# Patient Record
Sex: Female | Born: 1935 | Race: White | Hispanic: No | Smoking: Never smoker
Health system: Southern US, Community
[De-identification: ages and names within clinical notes are randomized; demographics above are authoritative.]

## PROBLEM LIST (undated history)

## (undated) DIAGNOSIS — I10 Essential (primary) hypertension: Secondary | ICD-10-CM

## (undated) DIAGNOSIS — E119 Type 2 diabetes mellitus without complications: Secondary | ICD-10-CM

## (undated) HISTORY — PX: OTHER SURGICAL HISTORY: SHX169

---

## 2006-02-15 ENCOUNTER — Ambulatory Visit: Payer: Self-pay | Admitting: Internal Medicine

## 2006-07-25 ENCOUNTER — Ambulatory Visit: Payer: Self-pay | Admitting: Orthopedic Surgery

## 2006-07-25 ENCOUNTER — Other Ambulatory Visit: Payer: Self-pay

## 2006-08-01 ENCOUNTER — Ambulatory Visit: Payer: Self-pay | Admitting: Orthopedic Surgery

## 2007-01-09 ENCOUNTER — Ambulatory Visit: Payer: Self-pay | Admitting: Rheumatology

## 2009-09-22 ENCOUNTER — Ambulatory Visit: Payer: Self-pay | Admitting: Physician Assistant

## 2009-10-05 ENCOUNTER — Ambulatory Visit: Payer: Self-pay | Admitting: Pain Medicine

## 2009-10-08 ENCOUNTER — Ambulatory Visit: Payer: Self-pay | Admitting: Pain Medicine

## 2009-11-15 ENCOUNTER — Ambulatory Visit: Payer: Self-pay | Admitting: Pain Medicine

## 2011-03-05 ENCOUNTER — Emergency Department: Payer: Self-pay | Admitting: Internal Medicine

## 2011-07-11 ENCOUNTER — Emergency Department: Payer: Self-pay | Admitting: *Deleted

## 2013-05-01 ENCOUNTER — Inpatient Hospital Stay: Payer: Self-pay | Admitting: Internal Medicine

## 2013-05-01 LAB — COMPREHENSIVE METABOLIC PANEL
Albumin: 3.9 g/dL (ref 3.4–5.0)
Alkaline Phosphatase: 118 U/L (ref 50–136)
BUN: 23 mg/dL — ABNORMAL HIGH (ref 7–18)
Calcium, Total: 9 mg/dL (ref 8.5–10.1)
Chloride: 109 mmol/L — ABNORMAL HIGH (ref 98–107)
Creatinine: 0.97 mg/dL (ref 0.60–1.30)
EGFR (African American): 57 — ABNORMAL LOW
Glucose: 138 mg/dL — ABNORMAL HIGH (ref 65–99)
Potassium: 4.1 mmol/L (ref 3.5–5.1)
SGPT (ALT): 13 U/L (ref 12–78)

## 2013-05-01 LAB — CBC WITH DIFFERENTIAL/PLATELET
Basophil #: 0 10*3/uL (ref 0.0–0.1)
Eosinophil #: 0.2 10*3/uL (ref 0.0–0.7)
Eosinophil %: 1.5 %
HCT: 36.8 % (ref 35.0–47.0)
Lymphocyte #: 1.2 10*3/uL (ref 1.0–3.6)
MCH: 32.7 pg (ref 26.0–34.0)
MCHC: 33.4 g/dL (ref 32.0–36.0)
Neutrophil #: 8.3 10*3/uL — ABNORMAL HIGH (ref 1.4–6.5)
Platelet: 152 10*3/uL (ref 150–440)
RDW: 14 % (ref 11.5–14.5)

## 2013-05-01 LAB — URINALYSIS, COMPLETE
Bilirubin,UR: NEGATIVE
Glucose,UR: NEGATIVE mg/dL (ref 0–75)
Ketone: NEGATIVE
Nitrite: NEGATIVE
Ph: 6 (ref 4.5–8.0)
RBC,UR: 4 /HPF (ref 0–5)
WBC UR: 2 /HPF (ref 0–5)

## 2013-05-03 LAB — BASIC METABOLIC PANEL
Anion Gap: 6 — ABNORMAL LOW (ref 7–16)
BUN: 14 mg/dL (ref 7–18)
Creatinine: 0.84 mg/dL (ref 0.60–1.30)
EGFR (African American): >60
EGFR (Non-African Amer.): 59 — ABNORMAL LOW
Osmolality: 287 (ref 275–301)
Potassium: 3.8 mmol/L (ref 3.5–5.1)

## 2013-05-03 LAB — CBC WITH DIFFERENTIAL/PLATELET
Basophil #: 0 10*3/uL (ref 0.0–0.1)
Basophil %: 0.6 %
Eosinophil #: 0.2 10*3/uL (ref 0.0–0.7)
HCT: 34 % — ABNORMAL LOW (ref 35.0–47.0)
HGB: 11.6 g/dL — ABNORMAL LOW (ref 12.0–16.0)
Lymphocyte #: 1.2 10*3/uL (ref 1.0–3.6)
MCH: 32.9 pg (ref 26.0–34.0)
MCHC: 34.2 g/dL (ref 32.0–36.0)
MCV: 97 fL (ref 80–100)
Neutrophil #: 3 10*3/uL (ref 1.4–6.5)
RBC: 3.52 10*6/uL — ABNORMAL LOW (ref 3.80–5.20)
WBC: 5.1 10*3/uL (ref 3.6–11.0)

## 2013-05-04 LAB — CBC WITH DIFFERENTIAL/PLATELET
Basophil #: 0 10*3/uL (ref 0.0–0.1)
Basophil %: 0.7 %
Eosinophil #: 0.2 10*3/uL (ref 0.0–0.7)
Eosinophil %: 4.2 %
HCT: 32.4 % — ABNORMAL LOW (ref 35.0–47.0)
HGB: 11.2 g/dL — ABNORMAL LOW (ref 12.0–16.0)
Lymphocyte #: 1.3 10*3/uL (ref 1.0–3.6)
Lymphocyte %: 25.6 %
MCH: 32.7 pg (ref 26.0–34.0)
MCHC: 34.5 g/dL (ref 32.0–36.0)
MCV: 95 fL (ref 80–100)
Monocyte #: 0.7 x10 3/mm (ref 0.2–0.9)
Monocyte %: 12.7 %
Neutrophil #: 2.9 10*3/uL (ref 1.4–6.5)
Neutrophil %: 56.8 %
Platelet: 148 10*3/uL — ABNORMAL LOW (ref 150–440)
RBC: 3.41 10*6/uL — ABNORMAL LOW (ref 3.80–5.20)
RDW: 13.8 % (ref 11.5–14.5)
WBC: 5.1 10*3/uL (ref 3.6–11.0)

## 2013-05-04 LAB — BASIC METABOLIC PANEL
BUN: 11 mg/dL (ref 7–18)
Calcium, Total: 8.8 mg/dL (ref 8.5–10.1)
Chloride: 112 mmol/L — ABNORMAL HIGH (ref 98–107)
Co2: 24 mmol/L (ref 21–32)
EGFR (African American): >60
EGFR (Non-African Amer.): >60
Glucose: 126 mg/dL — ABNORMAL HIGH (ref 65–99)
Osmolality: 288 (ref 275–301)
Sodium: 144 mmol/L (ref 136–145)

## 2013-05-06 LAB — CULTURE, BLOOD (SINGLE)

## 2013-08-11 ENCOUNTER — Emergency Department: Payer: Self-pay | Admitting: Emergency Medicine

## 2013-08-11 LAB — BASIC METABOLIC PANEL
Anion Gap: 6 — ABNORMAL LOW (ref 7–16)
BUN: 20 mg/dL — ABNORMAL HIGH (ref 7–18)
Calcium, Total: 9 mg/dL (ref 8.5–10.1)
Chloride: 110 mmol/L — ABNORMAL HIGH (ref 98–107)
Creatinine: 0.8 mg/dL (ref 0.60–1.30)
EGFR (African American): >60
EGFR (Non-African Amer.): >60
Glucose: 125 mg/dL — ABNORMAL HIGH (ref 65–99)
Osmolality: 285 (ref 275–301)
Potassium: 4 mmol/L (ref 3.5–5.1)
Sodium: 141 mmol/L (ref 136–145)

## 2013-08-11 LAB — CBC WITH DIFFERENTIAL/PLATELET
Basophil #: 0 10*3/uL (ref 0.0–0.1)
Basophil %: 0.6 %
Eosinophil %: 2.4 %
HCT: 39.7 % (ref 35.0–47.0)
Lymphocyte #: 1.8 10*3/uL (ref 1.0–3.6)
Lymphocyte %: 22.8 %
MCH: 31.9 pg (ref 26.0–34.0)
MCHC: 33.6 g/dL (ref 32.0–36.0)
MCV: 95 fL (ref 80–100)
Monocyte #: 0.8 x10 3/mm (ref 0.2–0.9)
Monocyte %: 10.3 %
Neutrophil #: 4.9 10*3/uL (ref 1.4–6.5)
RBC: 4.18 10*6/uL (ref 3.80–5.20)
WBC: 7.7 10*3/uL (ref 3.6–11.0)

## 2013-08-11 LAB — TROPONIN I: Troponin-I: <0.02 ng/mL

## 2013-08-14 ENCOUNTER — Ambulatory Visit: Payer: Self-pay | Admitting: Gastroenterology

## 2014-12-25 NOTE — Discharge Summary (Signed)
PATIENT NAME:  Denise Frank, Denise Frank MR#:  [id number] DATE OF BIRTH:  1915-02-06  DATE OF ADMISSION:  05/01/2013 DATE OF DISCHARGE:  05/05/2013  REASON FOR ADMISSION: Left upper extremity cellulitis.   HISTORY OF PRESENT ILLNESS: Please see the dictated HPI done by myself on 05/01/2013.   PAST MEDICAL HISTORY:  1.  Polymyalgia rheumatica.  2.  Type 2 diabetes. 3.  Hyperlipidemia.  4.  Hypothyroidism.  5.  Benign hypertension.  6.  Venous stasis with peripheral edema.  7.  Osteoarthritis.  8.  Osteoporosis.  9.  History of esophageal stricture, status post dilatation.  10. Status post hysterectomy.   MEDICATIONS ON ADMISSION: Please see admission note.   ALLERGIES: EVISTA, DEMEROL, FOSAMAX, TRAMADOL, CYMBALTA, SANCTURA, REMERON AND ELAVIL.   SOCIAL HISTORY, FAMILY HISTORY AND REVIEW OF SYSTEMS: As per admission note.   PHYSICAL EXAM:  GENERAL: The patient was in no acute distress.  VITAL SIGNS: Stable and she was afebrile. HEENT: Unremarkable.  NECK: Supple without JVD.  LUNGS: Clear.  CARDIOVASCULAR: Revealed a regular rate and rhythm. Normal S1, S2.  ABDOMEN: Soft and nontender.  EXTREMITIES: Without edema.  SKIN: Revealed swelling and erythema with tenderness and warmth to the left hand.  NEUROLOGIC: Grossly nonfocal.   HOSPITAL COURSE: The patient was admitted with progressive left upper extremity cellulitis due to a cat bite. Cultures were sent off and she was started on IV fluids with IV antibiotics. Her sugars were monitored with Accu-Cheks before meals and at bedtime and sliding scale insulin was used as needed. Baseline labs were stable. The patient improved with IV antibiotics. Blood cultures remained negative. She was switched to oral antibiotics with continued improvement of her cellulitis and pain. By 05/05/2013, the patient was stable and ready for discharge on oral antibiotics.   DISCHARGE DIAGNOSES:  1.  Left upper extremity cellulitis due to cat bite.  2.  Type  2 diabetes.  3.  Polymyalgia rheumatica.  4.  Benign hypertension.  5.  Hyperlipidemia.   DISCHARGE MEDICATIONS:  1.  Aspirin 81 mg p.o. daily.  2.  Doxycycline 100 mg p.o. b.i.d. x 10 days.  3.  Norco 10/325 mg 1 p.o. q.6 hours p.r.n. pain.  4.  Losartan 50 mg p.o. daily.  5.  Amaryl 6 mg p.o. daily.  6.  Januvia 100 mg p.o. daily.  7.  Zofran 4 mg p.o. q.4 hours p.r.n. nausea and vomiting.  8.  Benadryl 50 mg p.o. q.8 hours p.r.n. itching.  9.  Ambien 10 mg p.o. at bedtime.  10. MiraLax 17 grams p.o. at bedtime p.r.n. constipation. 11. Melatonin 3 mg p.o. at bedtime.  12. Augmentin 875 mg p.o. b.i.d. x 10 days.  13. Synthroid 0.1 mg p.o. daily.  FOLLOWUP PLANS AND APPOINTMENTS: The patient was discharged with home health, but she deferred. She was discharged on an 1800 calorie, ADA diet. She is to complete her course of antibiotics. She will follow up with me in the office within 1 week's time, sooner if needed.  ____________________________ Duane LopeJeffrey D. Judithann SheenSparks, MD jds:aw D: 05/20/2013 09:52:23 ET T: 05/20/2013 10:03:16 ET JOB#: [id number]  cc: Duane LopeJeffrey D. Judithann SheenSparks, MD, <Dictator> Ajaya Crutchfield Rodena Medin Treacy Holcomb MD ELECTRONICALLY SIGNED 05/20/2013 16:54

## 2014-12-25 NOTE — H&P (Signed)
PATIENT NAME:  Denise Frank, Denise Frank MR#:  [id number] DATE OF BIRTH:  May 28, 1915  DATE OF ADMISSION:  05/01/2013  REASON FOR ADMISSION:  Progressive left upper extremity cellulitis.   HISTORY OF PRESENT ILLNESS:  The patient is a 81 year old female with a history of type 2 diabetes, hypothyroidism, benign hypertension, and hyperlipidemia, as well as polymyalgia rheumatica. The patient presented to the office two days ago with a cat bite to the left hand. Was given 1 gram of Rocephin IM with a tetanus booster and placed on Augmentin. Presents now with worsening swelling, pain, and erythema of the left hand and arm. Denies fevers. Some nausea but no vomiting. No diarrhea. No chest pain or shortness of breath. She is now admitted for further evaluation.   PAST MEDICAL HISTORY:  1.  Polymyalgia rheumatica.  2.  Type 2 diabetes.  3.  Hyperlipidemia.  4.  Hypothyroidism.  5.  Benign hypertension.  6.  Venous stasis with peripheral edema.  7.  Osteoarthritis.  8.  Osteoporosis.  9.  A history of esophageal stricture status post dilatation. 10.  Status post hysterectomy.   MEDICATIONS:  1.  Amaryl 6 mg p.o. daily.  2.  Januvia 100 mg p.o. daily.  3.  Lasix 20 mg p.o. daily.  4.  Synthroid 100 mcg p.o. daily.  5.  Benadryl 50 mg p.o. at bedtime p.r.n.  6.  Norco 10/325, 1 p.o. q.6h. p.r.n., pain.  7.  MiraLAX 17 grams p.o. daily.   ALLERGIES:  EVISTA, DEMEROL, FOSAMAX, TRAMADOL, CYMBALTA, SANCTURA, REMERON, AND ELAVIL.   SOCIAL HISTORY:  Negative for alcohol or tobacco abuse. She has two daughters.   FAMILY HISTORY:  Positive for lung cancer, diabetes, brain cancer, and Alzheimer dementia.   REVIEW OF SYSTEMS:  As per HPI.   PHYSICAL EXAMINATION:  GENERAL:  The patient is in no acute distress.  VITAL SIGNS:  Vitals remarkable for a blood pressure of 118/50 with a temperature of 97.4. HEENT:  Pupils equally round and reactive to light and accommodation. Extraocular movements are intact.  Sclerae are anicteric. Conjunctivae are clear.  OROPHARYNX:  Clear.  NECK:  Supple without JVD. No adenopathy or thyromegaly noted.  LUNGS:  Clear to auscultation and percussion without wheezes, rales or rhonchi. No dullness. Respiratory effort is normal.  CARDIAC:  Regular rate and rhythm. Normal S1, S2. No significant rubs, murmurs, or gallops. PMI is nondisplaced. Chest wall is nontender.  ABDOMEN:  Soft, nontender, with normoactive bowel sounds. No organomegaly or masses were appreciated. No hernias or bruits were noted.  EXTREMITIES:  Without clubbing, cyanosis, edema. Pulses were 2+ bilaterally.  SKIN:  Warm and dry with a bite noted to the left hand with surrounding erythema, warmth, tenderness. No purulence. The erythema tracks up past her wrist.  NEUROLOGIC EXAM:  Grossly nonfocal.   ASSESSMENT: 1.  Progressive left upper extremity cellulitis.  2.  Cat bite.  3.  Type 2 diabetes.  4.  Polymyalgia rheumatica. 5.  Hyperlipidemia.  6.  Benign hypertension.  7.  Hypothyroidism.   PLAN:  The patient was admitted to the floor. We will send blood cultures. She will be started on IV fluids with IV antibiotics. We will monitor her sugars with Accu-Cheks before meals and at bedtime and add sliding scale insulin. We will obtain baseline labs on admission. Further treatment and evaluation will depend upon the patient's progress.   TOTAL TIME SPENT ON THIS PATIENT:  45 minutes.   ____________________________ Duane LopeJeffrey D. Judithann SheenSparks, MD jds:jm D: 05/01/2013  14:54:43 ET T: 05/01/2013 15:06:27 ET JOB#: [id number]  cc: Duane Lope. Judithann Sheen, MD, <Dictator> JEFFREY Rodena Medin MD ELECTRONICALLY SIGNED 05/01/2013 21:02

## 2015-11-26 ENCOUNTER — Emergency Department: Payer: Medicare Other

## 2015-11-26 ENCOUNTER — Encounter: Payer: Self-pay | Admitting: Emergency Medicine

## 2015-11-26 ENCOUNTER — Emergency Department
Admission: EM | Admit: 2015-11-26 | Discharge: 2015-11-26 | Disposition: A | Discharge status: Home / Self Care | Payer: Medicare Other | Attending: Emergency Medicine | Admitting: Emergency Medicine

## 2015-11-26 DIAGNOSIS — Y9289 Other specified places as the place of occurrence of the external cause: Secondary | ICD-10-CM | POA: Insufficient documentation

## 2015-11-26 DIAGNOSIS — S60222A Contusion of left hand, initial encounter: Secondary | ICD-10-CM | POA: Diagnosis not present

## 2015-11-26 DIAGNOSIS — Y939 Activity, unspecified: Secondary | ICD-10-CM | POA: Insufficient documentation

## 2015-11-26 DIAGNOSIS — S5012XA Contusion of left forearm, initial encounter: Secondary | ICD-10-CM

## 2015-11-26 DIAGNOSIS — S0083XA Contusion of other part of head, initial encounter: Secondary | ICD-10-CM | POA: Diagnosis not present

## 2015-11-26 DIAGNOSIS — Y999 Unspecified external cause status: Secondary | ICD-10-CM | POA: Diagnosis not present

## 2015-11-26 DIAGNOSIS — R6884 Jaw pain: Secondary | ICD-10-CM | POA: Diagnosis present

## 2015-11-26 DIAGNOSIS — E119 Type 2 diabetes mellitus without complications: Secondary | ICD-10-CM | POA: Diagnosis not present

## 2015-11-26 DIAGNOSIS — W0110XA Fall on same level from slipping, tripping and stumbling with subsequent striking against unspecified object, initial encounter: Secondary | ICD-10-CM | POA: Insufficient documentation

## 2015-11-26 DIAGNOSIS — S7002XA Contusion of left hip, initial encounter: Secondary | ICD-10-CM | POA: Diagnosis not present

## 2015-11-26 DIAGNOSIS — H9110 Presbycusis, unspecified ear: Secondary | ICD-10-CM | POA: Insufficient documentation

## 2015-11-26 HISTORY — DX: Type 2 diabetes mellitus without complications: E11.9

## 2015-11-26 LAB — URINALYSIS COMPLETE WITH MICROSCOPIC (ARMC ONLY)
BILIRUBIN URINE: NEGATIVE
Ketones, ur: NEGATIVE mg/dL
NITRITE: NEGATIVE
PROTEIN: 100 mg/dL — AB
SPECIFIC GRAVITY, URINE: 1.007 (ref 1.005–1.030)
Trans Epithel, UA: 1
pH: 7 (ref 5.0–8.0)

## 2015-11-26 NOTE — ED Provider Notes (Signed)
[facility] Emergency Department Provider Note  ____________________________________________  Time seen: 8:40 PM  I have reviewed the triage vital signs and the nursing notes.   HISTORY  Chief Complaint Fall    HPI Denise Frank is a 82 y.o. female brought [facility] the ED from [facility] after a [facility]. Patient states that she lost her balance and tripped and fell. No loss of consciousness. She fell and hit the left side of her face and onto her left side. She complains of some pain in her left jaw area. Also some pain in the left hip. No chest pain shortness of breath headache dizziness or syncope numbness tingling weakness vision changes or vomiting. Has been in her usual state of health. Does not take blood thinners.     Past Medical History  Diagnosis Date  . Diabetes mellitus without complication (HCC)   Presbycusis   There are no active problems to display for this patient.    History reviewed. No pertinent past surgical history.   No current outpatient prescriptions on file.   Allergies Demerol   No family history on file.  Social History Social History  Substance Use Topics  . Smoking status: Never Smoker   . Smokeless tobacco: None  . Alcohol Use: No    Review of Systems  Constitutional:   No fever or chills. No weight changes Eyes:   No vision changes.  ENT:   No sore throat. No rhinorrhea.Positive Left jaw pain  Cardiovascular:   No chest pain. Respiratory:   No dyspnea or cough. Gastrointestinal:   Negative for abdominal pain, vomiting and diarrhea.  No BRBPR or melena. Genitourinary:   Negative for dysuria or difficulty urinating. Musculoskeletal:   Positive left hip pain  Skin:   Negative for rash. Neurological:   Negative for headaches, focal weakness or numbness.  10-point ROS otherwise negative.  ____________________________________________   PHYSICAL EXAM:  VITAL SIGNS: ED Triage Vitals  Enc Vitals Group      BP --      Pulse Rate 11/26/15 2036 81     Resp 11/26/15 2036 14     Temp 11/26/15 2036 97.9 F (36.6 C)     Temp Source 11/26/15 2036 Oral     SpO2 11/26/15 2036 99 %     Weight 11/26/15 2036 138 lb (62.596 kg)     Height 11/26/15 2036  (1.651 m)     Head Cir --      Peak Flow --      Pain Score --      Pain Loc --      Pain Edu? --      Excl. in GC? --     Vital signs reviewed, nursing assessments reviewed.   Constitutional:   Alert and oriented. Well appearing and in no distress. Eyes:   No scleral icterus. No conjunctival pallor. PERRL. EOMI. There is some mild watery bilateral conjunctivitis consistent with allergies ENT   Head:   Normocephalic with large hematoma overlying the left parotid area. Tenderness at the angle of the left mandible. No instability or deformity. TMs normal. No hemotympanum   Nose:   No congestion/rhinnorhea. No septal hematoma   Mouth/Throat:   MMM, no pharyngeal erythema. No peritonsillar mass. No intraoral injury   Neck:   No stridor. No SubQ emphysema. No meningismus. Hematological/Lymphatic/Immunilogical:   No cervical lymphadenopathy. Cardiovascular:   RRR. Symmetric bilateral radial and DP pulses.  No murmurs.  Respiratory:   Normal respiratory  effort without tachypnea nor retractions. Breath sounds are clear and equal bilaterally. No wheezes/rales/rhonchi. Gastrointestinal:   Soft and nontender. Non distended. There is no CVA tenderness.  No rebound, rigidity, or guarding. Genitourinary:   deferred Musculoskeletal:  No midline spinal tenderness. There is a 2 cm hematoma overlying the proximal left radius and it is tender to the touch over the left radius in this area. No pain with pronation or supination. There is also tenderness at the left hip over the greater trochanter. Chronic left lateral shin pain which the patient states is due to varicose veins. This is at her baseline. No new swelling or edema of the lower  extremities. Neurologic:   Normal speech and language.  CN 2-10 normal. Motor grossly intact. Sensation intact in all distal extremities No gross focal neurologic deficits are appreciated.  Skin:    Skin is warm, dry, with small very superficial abrasion over the dorsal left radial hand. No rash noted.  No petechiae, purpura, or bullae. Psychiatric:   Mood and affect are normal. ____________________________________________    LABS (pertinent positives/negatives) (all labs ordered are listed, but only abnormal results are displayed) Labs Reviewed  URINALYSIS COMPLETEWITH MICROSCOPIC ([facility] ONLY) - Abnormal; Notable for the following:    Color, Urine STRAW (*)    APPearance CLEAR (*)    Glucose, UA >500 (*)    Hgb urine dipstick 2+ (*)    Protein, ur 100 (*)    Leukocytes, UA 1+ (*)    Bacteria, UA RARE (*)    Squamous Epithelial / LPF 0-5 (*)    All other components within normal limits  URINE CULTURE   ____________________________________________   EKG  Interpreted by me Normal sinus rhythm rate of 82, normal axis and intervals. Normal QRS ST segments and T waves.  ____________________________________________    RADIOLOGY  X-ray left forearm unremarkable X-ray left hip unremarkable CT head cervical spine and maxillofacial unremarkable except for left masseter hematoma.  ____________________________________________   PROCEDURES   ____________________________________________   INITIAL IMPRESSION / ASSESSMENT AND PLAN / ED COURSE  Pertinent labs & imaging results that were available during my care of the patient were reviewed by me and considered in my medical decision making (see chart for details).  Patient presents after a fall with evident trauma the left face left elbow and left hip. We'll get x-rays and CTs. Also has abrasion over the right hand that does not require any treatment at this time.   ----------------------------------------- 10:33 PM on  11/26/2015 -----------------------------------------  Workup negative. No UTI. Urine culture sent. Able to bear weight. We'll discharge home, Tylenol when necessary. Follow-up with primary care.    ____________________________________________   FINAL CLINICAL IMPRESSION(S) / ED DIAGNOSES  Final diagnoses:  Facial hematoma, initial encounter  Traumatic hematoma of forearm, left, initial encounter  Contusion of left hip, initial encounter      Sharman CheekPhillip Assia Meanor, MD 11/26/15 2233

## 2015-11-26 NOTE — Discharge Instructions (Signed)
Your xrays do not show any fractures of the left forearm or hip.  Your CT scans confirm that you have a hematoma (collection of blood) over the left jaw area arising from the facial muscles, but no serious injuries or fractures.  Keep ice on the painful areas tonight and take tylenol as needed for pain.  Contusion A contusion is a deep bruise. Contusions are the result of a blunt injury to tissues and muscle fibers under the skin. The injury causes bleeding under the skin. The skin overlying the contusion may turn blue, purple, or yellow. Minor injuries will give you a painless contusion, but more severe contusions may stay painful and swollen for a few weeks.  CAUSES  This condition is usually caused by a blow, trauma, or direct force to an area of the body. SYMPTOMS  Symptoms of this condition include:  Swelling of the injured area.  Pain and tenderness in the injured area.  Discoloration. The area may have redness and then turn blue, purple, or yellow. DIAGNOSIS  This condition is diagnosed based on a physical exam and medical history. An X-ray, CT scan, or MRI may be needed to determine if there are any associated injuries, such as broken bones (fractures). TREATMENT  Specific treatment for this condition depends on what area of the body was injured. In general, the best treatment for a contusion is resting, icing, applying pressure to (compression), and elevating the injured area. This is often called the RICE strategy. Over-the-counter anti-inflammatory medicines may also be recommended for pain control.  HOME CARE INSTRUCTIONS   Rest the injured area.  If directed, apply ice to the injured area:  Put ice in a plastic bag.  Place a towel between your skin and the bag.  Leave the ice on for 20 minutes, 2-3 times per day.  If directed, apply light compression to the injured area using an elastic bandage. Make sure the bandage is not wrapped too tightly. Remove and reapply the  bandage as directed by your health care provider.  If possible, raise (elevate) the injured area above the level of your heart while you are sitting or lying down.  Take over-the-counter and prescription medicines only as told by your health care provider. SEEK MEDICAL CARE IF:  Your symptoms do not improve after several days of treatment.  Your symptoms get worse.  You have difficulty moving the injured area. SEEK IMMEDIATE MEDICAL CARE IF:   You have severe pain.  You have numbness in a hand or foot.  Your hand or foot turns pale or cold.   This information is not intended to replace advice given to you by your health care provider. Make sure you discuss any questions you have with your health care provider.   Document Released: 05/31/2005 Document Revised: 05/12/2015 Document Reviewed: 01/06/2015 Elsevier Interactive Patient Education 2016 [employer]  Hematoma A hematoma is a collection of blood under the skin, in an organ, in a body space, in a joint space, or in other tissue. The blood can clot to form a lump that you can see and feel. The lump is often firm and may sometimes become sore and tender. Most hematomas get better in a few days to weeks. However, some hematomas may be serious and require medical care. Hematomas can range in size from very small to very large. CAUSES  A hematoma can be caused by a blunt or penetrating injury. It can also be caused by spontaneous leakage from a blood vessel under  the skin. Spontaneous leakage from a blood vessel is more likely to occur in older people, especially those taking blood thinners. Sometimes, a hematoma can develop after certain medical procedures. SIGNS AND SYMPTOMS   A firm lump on the body.  Possible pain and tenderness in the area.  Bruising.Blue, dark blue, purple-red, or yellowish skin may appear at the site of the hematoma if the hematoma is close to the surface of the skin. For hematomas in deeper tissues or  body spaces, the signs and symptoms may be subtle. For example, an intra-abdominal hematoma may cause abdominal pain, weakness, fainting, and shortness of breath. An intracranial hematoma may cause a headache or symptoms such as weakness, trouble speaking, or a change in consciousness. DIAGNOSIS  A hematoma can usually be diagnosed based on your medical history and a physical exam. Imaging tests may be needed if your health care provider suspects a hematoma in deeper tissues or body spaces, such as the abdomen, head, or chest. These tests may include ultrasonography or a CT scan.  TREATMENT  Hematomas usually go away on their own over time. Rarely does the blood need to be drained out of the body. Large hematomas or those that may affect vital organs will sometimes need surgical drainage or monitoring. HOME CARE INSTRUCTIONS   Apply ice to the injured area:   Put ice in a plastic bag.   Place a towel between your skin and the bag.   Leave the ice on for 20 minutes, 2-3 times a day for the first 1 to 2 days.   After the first 2 days, switch to using warm compresses on the hematoma.   Elevate the injured area to help decrease pain and swelling. Wrapping the area with an elastic bandage may also be helpful. Compression helps to reduce swelling and promotes shrinking of the hematoma. Make sure the bandage is not wrapped too tight.   If your hematoma is on a lower extremity and is painful, crutches may be helpful for a couple days.   Only take over-the-counter or prescription medicines as directed by your health care provider. SEEK IMMEDIATE MEDICAL CARE IF:   You have increasing pain, or your pain is not controlled with medicine.   You have a fever.   You have worsening swelling or discoloration.   Your skin over the hematoma breaks or starts bleeding.   Your hematoma is in your chest or abdomen and you have weakness, shortness of breath, or a change in consciousness.  Your  hematoma is on your scalp (caused by a fall or injury) and you have a worsening headache or a change in alertness or consciousness. MAKE SURE YOU:   Understand these instructions.  Will watch your condition.  Will get help right away if you are not doing well or get worse.   This information is not intended to replace advice given to you by your health care provider. Make sure you discuss any questions you have with your health care provider.   Document Released: 04/04/2004 Document Revised: 04/23/2013 Document Reviewed: 01/29/2013 Elsevier Interactive Patient Education [employer]

## 2015-11-26 NOTE — ED Notes (Signed)
Pt comes into the ED via EMS from [facility] c/o fall.  Hematoma to left side of jaw, bruising to the left hand, denies LOC, and can move mandible well without any complaints.  Patient denies any blood thinners.

## 2015-11-28 LAB — URINE CULTURE

## 2017-04-11 ENCOUNTER — Encounter: Payer: Self-pay | Admitting: Emergency Medicine

## 2017-04-11 ENCOUNTER — Emergency Department
Admission: EM | Admit: 2017-04-11 | Discharge: 2017-04-11 | Disposition: A | Discharge status: Home / Self Care | Payer: Medicare Other | Attending: Emergency Medicine | Admitting: Emergency Medicine

## 2017-04-11 DIAGNOSIS — E119 Type 2 diabetes mellitus without complications: Secondary | ICD-10-CM | POA: Insufficient documentation

## 2017-04-11 DIAGNOSIS — R531 Weakness: Secondary | ICD-10-CM | POA: Insufficient documentation

## 2017-04-11 LAB — CBC WITH DIFFERENTIAL/PLATELET
Basophils Absolute: 0.1 10*3/uL (ref 0–0.1)
Basophils Relative: 1 %
Eosinophils Absolute: 0.5 10*3/uL (ref 0–0.7)
Eosinophils Relative: 7 %
HCT: 35.9 % (ref 35.0–47.0)
Hemoglobin: 11.9 g/dL — ABNORMAL LOW (ref 12.0–16.0)
Lymphocytes Relative: 21 %
Lymphs Abs: 1.4 10*3/uL (ref 1.0–3.6)
MCH: 31.3 pg (ref 26.0–34.0)
MCHC: 33.2 g/dL (ref 32.0–36.0)
MCV: 94.1 fL (ref 80.0–100.0)
Monocytes Absolute: 0.6 10*3/uL (ref 0.2–0.9)
Monocytes Relative: 9 %
Neutro Abs: 4.3 10*3/uL (ref 1.4–6.5)
Neutrophils Relative %: 62 %
Platelets: 196 10*3/uL (ref 150–440)
RBC: 3.81 MIL/uL (ref 3.80–5.20)
RDW: 14 % (ref 11.5–14.5)
WBC: 6.8 10*3/uL (ref 3.6–11.0)

## 2017-04-11 LAB — BASIC METABOLIC PANEL
Anion gap: 6 (ref 5–15)
BUN: 17 mg/dL (ref 6–20)
CO2: 22 mmol/L (ref 22–32)
Calcium: 8.9 mg/dL (ref 8.9–10.3)
Chloride: 111 mmol/L (ref 101–111)
Creatinine, Ser: 1.03 mg/dL — ABNORMAL HIGH (ref 0.44–1.00)
GFR calc Af Amer: 50 mL/min — ABNORMAL LOW (ref >60)
GFR calc non Af Amer: 43 mL/min — ABNORMAL LOW (ref >60)
Glucose, Bld: 174 mg/dL — ABNORMAL HIGH (ref 65–99)
Potassium: 4.4 mmol/L (ref 3.5–5.1)
Sodium: 139 mmol/L (ref 135–145)

## 2017-04-11 LAB — URINALYSIS, COMPLETE (UACMP) WITH MICROSCOPIC
BACTERIA UA: NONE SEEN
BILIRUBIN URINE: NEGATIVE
Glucose, UA: NEGATIVE mg/dL
Ketones, ur: NEGATIVE mg/dL
LEUKOCYTES UA: NEGATIVE
NITRITE: NEGATIVE
PH: 5 (ref 5.0–8.0)
Protein, ur: NEGATIVE mg/dL
SPECIFIC GRAVITY, URINE: 1.012 (ref 1.005–1.030)

## 2017-04-11 LAB — TROPONIN I: Troponin I: <0.03 ng/mL (ref <0.03)

## 2017-04-11 NOTE — ED Triage Notes (Signed)
[facility] c/o was pt was eating breakfast and residents noticed pt was leaning to RT side, pt has chronic weakness but residents states pt was not at baseline weakness. Pt A&Ox4, denies pain. VS stable.

## 2017-04-11 NOTE — Discharge Instructions (Signed)
Please seek medical attention for any high fevers, chest pain, shortness of breath, change in behavior, persistent vomiting, bloody stool or any other new or concerning symptoms.  

## 2017-04-11 NOTE — ED Provider Notes (Signed)
[facility] Emergency Department Provider Note  ____________________________________________   I have reviewed the triage vital signs and the nursing notes.   HISTORY  Chief Complaint Weakness   History limited by: Not Limited   HPI Denise Frank is a 83 y.o. female who presents to the emergency department today via EMS because of concerns for weakness. The patient states that she has felt weak all morning. Today at breakfast one of her table mates noticed that she slumped over. Since that time she has been awake and alert. She typically is not that week. She denies any headache. Denies any chest pain or shortness of breath. She denies any recent illness. She does state that recently she has been having a harder time urinating. She denies any pain with urination.   Past Medical History:  Diagnosis Date  . Diabetes mellitus without complication (HCC)     There are no active problems to display for this patient.   History reviewed. No pertinent surgical history.  Prior to Admission medications   Not on File    Allergies Demerol [meperidine]  No family history on file.  Social History Social History  Substance Use Topics  . Smoking status: Never Smoker  . Smokeless tobacco: Never Used  . Alcohol use No    Review of Systems Constitutional: No fever/chills Eyes: No visual changes. ENT: No sore throat. Cardiovascular: Denies chest pain. Respiratory: Denies shortness of breath. Gastrointestinal: No abdominal pain.  No nausea, no vomiting.  No diarrhea.   Genitourinary: Negative for dysuria. Musculoskeletal: Negative for back pain. Skin: Negative for rash. Neurological: Negative for headaches, focal weakness or numbness.  ____________________________________________   PHYSICAL EXAM:  VITAL SIGNS: ED Triage Vitals [04/11/17 0825]  Enc Vitals Group     BP 124/72     Pulse Rate 73     Resp 16     Temp      Temp src      SpO2 97  %     Weight      Height      Head Circumference      Peak Flow      Pain Score 0    Constitutional: Alert and oriented. Well appearing and in no distress. Eyes: Conjunctivae are normal.  ENT   Head: Normocephalic and atraumatic.   Nose: No congestion/rhinnorhea.   Mouth/Throat: Mucous membranes are moist.   Neck: No stridor. Hematological/Lymphatic/Immunilogical: No cervical lymphadenopathy. Cardiovascular: Normal rate, regular rhythm.  No murmurs, rubs, or gallops.  Respiratory: Normal respiratory effort without tachypnea nor retractions. Breath sounds are clear and equal bilaterally. No wheezes/rales/rhonchi. Gastrointestinal: Soft and non tender. No rebound. No guarding.  Genitourinary: Deferred Musculoskeletal: Normal range of motion in all extremities. No lower extremity edema. Neurologic:  Normal speech and language. No gross focal neurologic deficits are appreciated.  Skin:  Skin is warm, dry and intact. No rash noted. Psychiatric: Mood and affect are normal. Speech and behavior are normal. Patient exhibits appropriate insight and judgment.  ____________________________________________    LABS (pertinent positives/negatives)  Labs Reviewed  CBC WITH DIFFERENTIAL/PLATELET - Abnormal; Notable for the following:       Result Value   Hemoglobin 11.9 (*)    All other components within normal limits  BASIC METABOLIC PANEL - Abnormal; Notable for the following:    Glucose, Bld 174 (*)    Creatinine, Ser 1.03 (*)    GFR calc non Af Amer 43 (*)    GFR calc Af Amer 50 (*)  All other components within normal limits  URINALYSIS, COMPLETE (UACMP) WITH MICROSCOPIC - Abnormal; Notable for the following:    Color, Urine YELLOW (*)    APPearance CLEAR (*)    Hgb urine dipstick SMALL (*)    Squamous Epithelial / LPF 0-5 (*)    All other components within normal limits  TROPONIN I     ____________________________________________   EKG  I, Phineas SemenGraydon Deshea Pooley,  attending physician, personally viewed and interpreted this EKG  EKG Time: 0835 Rate: 68 Rhythm: normal sinus rhythm Axis: normal Intervals: qtc 448 QRS: narrow, q waves V1 ST changes: no st elevation Impression: abnormal ekg   ____________________________________________    RADIOLOGY  None  ____________________________________________   PROCEDURES  Procedures  ____________________________________________   INITIAL IMPRESSION / ASSESSMENT AND PLAN / ED COURSE  Pertinent labs & imaging results that were available during my care of the patient were reviewed by me and considered in my medical decision making (see chart for details).  Patient presented to the emergency department today after an episode of weakness. Blood work without any concerning findings. Urine without findings for infection. I had a discussion with the patient's daughter. At this point she feels comfortable taking the patient home. She states she has had similar episodes in the past. I did discuss possibility of stroke and further work up however patient's daughter felt comfortable deferring this time. I do think this is reasonable. Will plan on discharge follow-up with primary care.  ____________________________________________   FINAL CLINICAL IMPRESSION(S) / ED DIAGNOSES  Final diagnoses:  Weakness     Note: This dictation was prepared with Dragon dictation. Any transcriptional errors that result from this process are unintentional     Phineas SemenGoodman, Zoe Creasman, MD 04/11/17 [phone]

## 2017-04-11 NOTE — ED Notes (Addendum)
Family at bedside. 

## 2017-04-11 NOTE — ED Notes (Signed)
Pt daughter signed esignature due to pt preference . Pt in wc to car, NAD noted, pt and family verbalize d/c instructions.

## 2017-06-21 ENCOUNTER — Observation Stay
Admission: EM | Admit: 2017-06-21 | Discharge: 2017-06-22 | Disposition: A | Discharge status: Home / Self Care | Payer: Medicare Other | Attending: Internal Medicine | Admitting: Internal Medicine

## 2017-06-21 DIAGNOSIS — M353 Polymyalgia rheumatica: Secondary | ICD-10-CM | POA: Insufficient documentation

## 2017-06-21 DIAGNOSIS — W19XXXA Unspecified fall, initial encounter: Secondary | ICD-10-CM | POA: Diagnosis not present

## 2017-06-21 DIAGNOSIS — E039 Hypothyroidism, unspecified: Secondary | ICD-10-CM | POA: Insufficient documentation

## 2017-06-21 DIAGNOSIS — M545 Low back pain: Secondary | ICD-10-CM | POA: Diagnosis not present

## 2017-06-21 DIAGNOSIS — R55 Syncope and collapse: Secondary | ICD-10-CM | POA: Diagnosis present

## 2017-06-21 DIAGNOSIS — R946 Abnormal results of thyroid function studies: Secondary | ICD-10-CM | POA: Insufficient documentation

## 2017-06-21 DIAGNOSIS — F329 Major depressive disorder, single episode, unspecified: Secondary | ICD-10-CM | POA: Insufficient documentation

## 2017-06-21 DIAGNOSIS — I10 Essential (primary) hypertension: Secondary | ICD-10-CM | POA: Insufficient documentation

## 2017-06-21 DIAGNOSIS — Z8249 Family history of ischemic heart disease and other diseases of the circulatory system: Secondary | ICD-10-CM | POA: Insufficient documentation

## 2017-06-21 DIAGNOSIS — E119 Type 2 diabetes mellitus without complications: Secondary | ICD-10-CM | POA: Diagnosis not present

## 2017-06-21 DIAGNOSIS — E785 Hyperlipidemia, unspecified: Secondary | ICD-10-CM | POA: Diagnosis not present

## 2017-06-21 DIAGNOSIS — Z66 Do not resuscitate: Secondary | ICD-10-CM | POA: Insufficient documentation

## 2017-06-21 DIAGNOSIS — Z79899 Other long term (current) drug therapy: Secondary | ICD-10-CM | POA: Diagnosis not present

## 2017-06-21 DIAGNOSIS — Z7982 Long term (current) use of aspirin: Secondary | ICD-10-CM | POA: Insufficient documentation

## 2017-06-21 DIAGNOSIS — Z79890 Hormone replacement therapy (postmenopausal): Secondary | ICD-10-CM | POA: Insufficient documentation

## 2017-06-21 DIAGNOSIS — S0101XA Laceration without foreign body of scalp, initial encounter: Secondary | ICD-10-CM | POA: Diagnosis not present

## 2017-06-21 HISTORY — DX: Essential (primary) hypertension: I10

## 2017-06-22 ENCOUNTER — Encounter: Payer: Self-pay | Admitting: *Deleted

## 2017-06-22 ENCOUNTER — Observation Stay: Admit: 2017-06-22 | Payer: Medicare Other

## 2017-06-22 ENCOUNTER — Emergency Department: Payer: Medicare Other

## 2017-06-22 DIAGNOSIS — R55 Syncope and collapse: Secondary | ICD-10-CM | POA: Diagnosis present

## 2017-06-22 DIAGNOSIS — W19XXXA Unspecified fall, initial encounter: Secondary | ICD-10-CM

## 2017-06-22 LAB — GLUCOSE, CAPILLARY
Glucose-Capillary: 231 mg/dL — ABNORMAL HIGH (ref 65–99)
Glucose-Capillary: 203 mg/dL — ABNORMAL HIGH (ref 65–99)

## 2017-06-22 LAB — COMPREHENSIVE METABOLIC PANEL
ALT: 16 U/L (ref 14–54)
ANION GAP: 11 (ref 5–15)
AST: 24 U/L (ref 15–41)
Albumin: 4.1 g/dL (ref 3.5–5.0)
Alkaline Phosphatase: 90 U/L (ref 38–126)
BUN: 25 mg/dL — ABNORMAL HIGH (ref 6–20)
CO2: 24 mmol/L (ref 22–32)
Calcium: 9.4 mg/dL (ref 8.9–10.3)
Chloride: 103 mmol/L (ref 101–111)
Creatinine, Ser: 0.95 mg/dL (ref 0.44–1.00)
GFR calc non Af Amer: 48 mL/min — ABNORMAL LOW (ref >60)
GFR, EST AFRICAN AMERICAN: 55 mL/min — AB (ref >60)
Glucose, Bld: 236 mg/dL — ABNORMAL HIGH (ref 65–99)
Potassium: 4.3 mmol/L (ref 3.5–5.1)
SODIUM: 138 mmol/L (ref 135–145)
Total Bilirubin: 0.4 mg/dL (ref 0.3–1.2)
Total Protein: 7.7 g/dL (ref 6.5–8.1)

## 2017-06-22 LAB — CBC
HEMATOCRIT: 35.9 % (ref 35.0–47.0)
HEMOGLOBIN: 11.9 g/dL — AB (ref 12.0–16.0)
MCH: 31.1 pg (ref 26.0–34.0)
MCHC: 33 g/dL (ref 32.0–36.0)
MCV: 94.1 fL (ref 80.0–100.0)
Platelets: 179 10*3/uL (ref 150–440)
RBC: 3.82 MIL/uL (ref 3.80–5.20)
RDW: 13.9 % (ref 11.5–14.5)
WBC: 12.6 10*3/uL — AB (ref 3.6–11.0)

## 2017-06-22 LAB — TSH: TSH: 8.824 u[IU]/mL — ABNORMAL HIGH (ref 0.350–4.500)

## 2017-06-22 LAB — CK: CK TOTAL: 106 U/L (ref 38–234)

## 2017-06-22 LAB — MAGNESIUM: MAGNESIUM: 2 mg/dL (ref 1.7–2.4)

## 2017-06-22 LAB — TROPONIN I: Troponin I: <0.03 ng/mL (ref <0.03)

## 2017-06-22 MED ORDER — MORPHINE SULFATE (PF) 2 MG/ML IV SOLN
2.0000 mg | INTRAVENOUS | Status: DC | PRN
  Administered 2017-06-22 (×2): 2 mg via INTRAVENOUS
  Filled 2017-06-22 (×2): qty 1

## 2017-06-22 MED ORDER — LIDOCAINE HCL (PF) 1 % IJ SOLN
5.0000 mL | Freq: Once | INTRAMUSCULAR | Status: AC
  Administered 2017-06-22: 5 mL via INTRADERMAL

## 2017-06-22 MED ORDER — ACETAMINOPHEN 325 MG PO TABS: 650.0000 mg | ORAL_TABLET | Freq: Four times a day (QID) | ORAL | Status: DC | PRN

## 2017-06-22 MED ORDER — SODIUM CHLORIDE 0.9 % IV SOLN
INTRAVENOUS | Status: DC
  Administered 2017-06-22: 06:00:00 via INTRAVENOUS

## 2017-06-22 MED ORDER — SIMETHICONE 80 MG PO CHEW
80.0000 mg | CHEWABLE_TABLET | Freq: Every day | ORAL | Status: DC | PRN
  Filled 2017-06-22: qty 1

## 2017-06-22 MED ORDER — MORPHINE SULFATE (PF) 2 MG/ML IV SOLN
INTRAVENOUS | Status: AC
  Filled 2017-06-22: qty 1

## 2017-06-22 MED ORDER — MORPHINE SULFATE (PF) 2 MG/ML IV SOLN
2.0000 mg | Freq: Once | INTRAVENOUS | Status: AC
  Administered 2017-06-22: 2 mg via INTRAVENOUS
  Filled 2017-06-22: qty 1

## 2017-06-22 MED ORDER — ONDANSETRON HCL 4 MG PO TABS: 4.0000 mg | ORAL_TABLET | Freq: Four times a day (QID) | ORAL | Status: DC | PRN

## 2017-06-22 MED ORDER — ASPIRIN 81 MG PO CHEW
81.0000 mg | CHEWABLE_TABLET | Freq: Every day | ORAL | Status: DC
  Administered 2017-06-22: 81 mg via ORAL
  Filled 2017-06-22: qty 1

## 2017-06-22 MED ORDER — ONDANSETRON HCL 4 MG/2ML IJ SOLN
4.0000 mg | Freq: Once | INTRAMUSCULAR | Status: AC
  Administered 2017-06-22: 4 mg via INTRAVENOUS

## 2017-06-22 MED ORDER — MORPHINE SULFATE (PF) 2 MG/ML IV SOLN
2.0000 mg | Freq: Once | INTRAVENOUS | Status: AC
  Administered 2017-06-22: 2 mg via INTRAVENOUS

## 2017-06-22 MED ORDER — DOXEPIN HCL 10 MG PO CAPS
10.0000 mg | ORAL_CAPSULE | Freq: Two times a day (BID) | ORAL | Status: DC
  Administered 2017-06-22: 10 mg via ORAL
  Filled 2017-06-22 (×2): qty 1

## 2017-06-22 MED ORDER — ONDANSETRON HCL 4 MG/2ML IJ SOLN: 4.0000 mg | Freq: Four times a day (QID) | INTRAMUSCULAR | Status: DC | PRN

## 2017-06-22 MED ORDER — CLONIDINE HCL 0.1 MG PO TABS: 0.1000 mg | ORAL_TABLET | Freq: Once | ORAL | Status: DC

## 2017-06-22 MED ORDER — SERTRALINE HCL 50 MG PO TABS
50.0000 mg | ORAL_TABLET | Freq: Every day | ORAL | Status: DC
Start: 2017-06-22 — End: 2017-06-22

## 2017-06-22 MED ORDER — BUSPIRONE HCL 15 MG PO TABS
7.5000 mg | ORAL_TABLET | Freq: Two times a day (BID) | ORAL | Status: DC
  Administered 2017-06-22: 7.5 mg via ORAL
  Filled 2017-06-22 (×2): qty 1

## 2017-06-22 MED ORDER — INSULIN ASPART 100 UNIT/ML ~~LOC~~ SOLN: 0.0000 [IU] | Freq: Every day | SUBCUTANEOUS | Status: DC

## 2017-06-22 MED ORDER — ONDANSETRON HCL 4 MG/2ML IJ SOLN
INTRAMUSCULAR | Status: AC
  Administered 2017-06-22: 4 mg via INTRAVENOUS
  Filled 2017-06-22: qty 2

## 2017-06-22 MED ORDER — ZOLPIDEM TARTRATE 5 MG PO TABS: 5.0000 mg | ORAL_TABLET | Freq: Every evening | ORAL | Status: DC | PRN

## 2017-06-22 MED ORDER — DOCUSATE SODIUM 100 MG PO CAPS
100.0000 mg | ORAL_CAPSULE | Freq: Two times a day (BID) | ORAL | Status: DC
  Filled 2017-06-22: qty 1

## 2017-06-22 MED ORDER — LEVOTHYROXINE SODIUM 100 MCG PO TABS
100.0000 ug | ORAL_TABLET | Freq: Every day | ORAL | Status: DC
  Administered 2017-06-22: 100 ug via ORAL
  Filled 2017-06-22: qty 1

## 2017-06-22 MED ORDER — LINAGLIPTIN 5 MG PO TABS
5.0000 mg | ORAL_TABLET | Freq: Every day | ORAL | Status: DC
  Administered 2017-06-22: 5 mg via ORAL
  Filled 2017-06-22: qty 1

## 2017-06-22 MED ORDER — ONDANSETRON HCL 4 MG/2ML IJ SOLN
4.0000 mg | Freq: Once | INTRAMUSCULAR | Status: AC
  Administered 2017-06-22: 4 mg via INTRAVENOUS
  Filled 2017-06-22: qty 2

## 2017-06-22 MED ORDER — LIDOCAINE HCL (PF) 1 % IJ SOLN
INTRAMUSCULAR | Status: AC
  Administered 2017-06-22: 5 mL via INTRADERMAL
  Filled 2017-06-22: qty 5

## 2017-06-22 MED ORDER — MELATONIN 5 MG PO TABS
5.0000 mg | ORAL_TABLET | Freq: Every day | ORAL | Status: DC
Start: 2017-06-22 — End: 2017-06-22
  Filled 2017-06-22: qty 1

## 2017-06-22 MED ORDER — HYDROXYZINE HCL 25 MG PO TABS: 25.0000 mg | ORAL_TABLET | Freq: Four times a day (QID) | ORAL | Status: DC | PRN

## 2017-06-22 MED ORDER — FUROSEMIDE 20 MG PO TABS: 20.0000 mg | ORAL_TABLET | Freq: Every day | ORAL | Status: DC | PRN

## 2017-06-22 MED ORDER — ACETAMINOPHEN 650 MG RE SUPP: 650.0000 mg | Freq: Four times a day (QID) | RECTAL | Status: DC | PRN

## 2017-06-22 MED ORDER — OXYCODONE-ACETAMINOPHEN 5-325 MG PO TABS: 1.0000 | ORAL_TABLET | Freq: Four times a day (QID) | ORAL | Status: DC | PRN

## 2017-06-22 MED ORDER — ISOSORBIDE MONONITRATE ER 30 MG PO TB24
30.0000 mg | ORAL_TABLET | Freq: Every day | ORAL | Status: DC
Start: 2017-06-22 — End: 2017-06-22
  Administered 2017-06-22: 30 mg via ORAL
  Filled 2017-06-22: qty 1

## 2017-06-22 MED ORDER — INSULIN ASPART 100 UNIT/ML ~~LOC~~ SOLN
0.0000 [IU] | Freq: Three times a day (TID) | SUBCUTANEOUS | Status: DC
  Filled 2017-06-22: qty 1

## 2017-06-22 NOTE — Care Management Note (Signed)
Case Management Note  Patient Details  Name: Denise Frank MRN: [id number] Date of Birth: 05/05/1915  Subjective/Objective:    Admitted to [facility] under observation status with the diagnosis of syncope. Daughter is Carney BernJean [phone]). Last seen Dr Judithann SheenSparks in May 2018. [facility] [address]. No Home Health. No skilled facility. No home oxygen. Caregiver comes to home Monday-Wednesday-Friday for bath. Rolling walker in the home. Self feed. Fell Wednesday, Fair appetite. No weight loss or gain. Retired Engineer, civil (consulting)nurse.                Action/Plan: Will continue to follow for discharge needs   Expected Discharge Date:                  Expected Discharge Plan:     In-House Referral:     Discharge planning Services     Post Acute Care Choice:    Choice offered to:     DME Arranged:    DME Agency:     HH Arranged:    HH Agency:     Status of Service:     If discussed at [employer] Length of Stay Meetings, dates discussed:    Additional Comments:  Gwenette GreetBrenda S Adalberto Metzgar, RN MSN CCM Care Management [phone] 06/22/2017, 11:13 AM

## 2017-06-22 NOTE — Consult Note (Signed)
Cardiology Consultation:   Patient ID: Denise Frank; [id number]; 04-12-1915   Admit date: 06/21/2017 Date of Consult: 06/22/2017  Primary Care Provider: Marguarite Arbour, MD Primary Cardiologist: New to [facility]   Patient Profile:   Denise Frank is a 83 y.o. female with a hx of HTN, HLD, polymyalgia rheumatica, esophageal stricture, DM2, hypothyroidism, OA, and low back pain who is being seen today for the evaluation of fall vs syncope in the setting of Ambien usage at the request of Diamond.  History of Present Illness:   Ms. Denise Frank has no previously known cardiac history. She lives alone. She was in her usual state of health until the night of 10/18. She had taken an Ambien 5 mg tab to help sleep. A short while later she got up to use the restroom and suffered either a fall vs syncope. No peri-event chest pain, SOB, diaphoresis, palpitations, nausea, or vomiting. She was able to slide over to a phone and call for help. She is not certain how long she was on the floor.   Upon the patient's arrival to [facility] they were found to have BP 233/110, HR 78, temp 97.4, oxygen saturation 100% on room air, weight 135 pounds. EKG showed not acute, CXR not performed. CT head/neck/lumbar spine not acute. Labs showed troponin negative x 1, CK 106, WBC 12.6, HGB 11.9, PLT 179, SCr 0.95, K+ 4.3. She was given normal saline and morphine in the ED. BP has improved. Currently without complaints.   Past Medical History:  Diagnosis Date  . Diabetes mellitus without complication (HCC)   . Hypertension     Past Surgical History:  Procedure Laterality Date  . none       Home Meds: Prior to Admission medications   Medication Sig Start Date End Date Taking? Authorizing Provider  aspirin 81 MG chewable tablet Chew 81 mg by mouth daily.    Yes [provider]  busPIRone (BUSPAR) 7.5 MG tablet Take 1 tablet by mouth 2 (two) times daily. 01/30/17  Yes [provider]    doxepin (SINEQUAN) 10 MG capsule Take 10 mg by mouth 2 (two) times daily.   Yes [provider]  furosemide (LASIX) 20 MG tablet Take 20 mg by mouth daily as needed for fluid or edema.    Yes [provider]  glimepiride (AMARYL) 4 MG tablet Take 8 mg by mouth daily with breakfast.   Yes [provider]  hydrOXYzine (ATARAX/VISTARIL) 25 MG tablet Take 1 mg by mouth 4 (four) times daily as needed for itching.  03/26/17  Yes [provider]  isosorbide mononitrate (IMDUR) 30 MG 24 hr tablet Take 30 mg by mouth daily.   Yes [provider]  levothyroxine (SYNTHROID, LEVOTHROID) 100 MCG tablet Take 100 mcg by mouth daily before breakfast.   Yes [provider]  Melatonin 3 MG TABS Take 3 mg by mouth at bedtime.   Yes [provider]  metFORMIN (GLUCOPHAGE) 500 MG tablet Take 500 mg by mouth 2 (two) times daily. 01/18/17 01/18/18 Yes [provider]  sertraline (ZOLOFT) 50 MG tablet Take 50 mg by mouth at bedtime.   Yes [provider]  Simethicone 125 MG CAPS Take 125 mg by mouth daily as needed.    Yes [provider]  sitaGLIPtin (JANUVIA) 100 MG tablet Take 100 mg by mouth daily.   Yes [provider]  zolpidem (AMBIEN) 10 MG tablet Take 10 mg by mouth at  bedtime as needed for sleep.   Yes [provider]    Inpatient Medications: Scheduled Meds: . aspirin  81 mg Oral Daily  . busPIRone  7.5 mg Oral BID  . cloNIDine  0.1 mg Oral Once  . docusate sodium  100 mg Oral BID  . doxepin  10 mg Oral BID  . insulin aspart  0-5 Units Subcutaneous QHS  . insulin aspart  0-9 Units Subcutaneous TID WC  . isosorbide mononitrate  30 mg Oral Daily  . levothyroxine  100 mcg Oral QAC breakfast  . linagliptin  5 mg Oral Daily  . Melatonin  3 mg Oral QHS  . sertraline  50 mg Oral QHS   Continuous Infusions: . sodium chloride 100 mL/hr at 06/22/17 0612   PRN Meds: acetaminophen **OR** acetaminophen,  furosemide, hydrOXYzine, morphine injection, ondansetron **OR** ondansetron (ZOFRAN) IV, oxyCODONE-acetaminophen, Simethicone, zolpidem  Allergies:   Allergies  Allergen Reactions  . Demerol [Meperidine] Nausea And Vomiting    Social History:   Social History   Social History  . Marital status: Widowed    Spouse name: N/A  . Number of children: N/A  . Years of education: N/A   Occupational History  . Not on file.   Social History Main Topics  . Smoking status: Never Smoker  . Smokeless tobacco: Never Used  . Alcohol use No  . Drug use: No  . Sexual activity: Not on file   Other Topics Concern  . Not on file   Social History Narrative  . No narrative on file     Family History:  Family History  Problem Relation Age of Onset  . Hypertension Father   . Brain cancer Father   . Diabetes Mother   . CAD Neg Hx     ROS:  Review of Systems  Constitutional: Positive for malaise/fatigue. Negative for chills, diaphoresis, fever and weight loss.  HENT: Negative for congestion.   Eyes: Negative for discharge and redness.  Respiratory: Negative for cough, hemoptysis, sputum production, shortness of breath and wheezing.   Cardiovascular: Negative for chest pain, palpitations, orthopnea, claudication, leg swelling and PND.  Gastrointestinal: Negative for abdominal pain, blood in stool, heartburn, melena, nausea and vomiting.  Genitourinary: Negative for hematuria.  Musculoskeletal: Positive for falls. Negative for myalgias.  Skin: Negative for rash.  Neurological: Positive for loss of consciousness. Negative for dizziness, tingling, tremors, sensory change, speech change, focal weakness, weakness and headaches.  Endo/Heme/Allergies: Does not bruise/bleed easily.  Psychiatric/Behavioral: Negative for substance abuse. The patient is not nervous/anxious.   All other systems reviewed and are negative.     Physical Exam/Data:   Vitals:   06/22/17 0400 06/22/17 0430 06/22/17  0500 06/22/17 0701  BP: (!) 114/58 138/61 137/63 (!) 132/50  Pulse: 65 63 64 66  Resp: 12 12 12    Temp:    98.4 F (36.9 C)  TempSrc:    Oral  SpO2: 96% 99% 98% 99%  Weight:      Height:        Intake/Output Summary (Last 24 hours) at 06/22/17 0800 Last data filed at 06/22/17 0659  Gross per 24 hour  Intake            78.33 ml  Output                0 ml  Net            78.33 ml   Filed Weights   06/22/17 0006  Weight: 135 lb (  61.2 kg)   Body mass index is 23.91 kg/m.   Physical Exam: General: Elderly and frail appearing, in no acute distress. Head: Normocephalic, atraumatic, sclera non-icteric, no xanthomas, nares are without discharge. Neck: Negative for carotid bruits. JVD not elevated. Lungs: Clear bilaterally to auscultation without wheezes, rales, or rhonchi. Breathing is unlabored. Heart: RRR with S1 S2. No murmurs, rubs, or gallops appreciated. Abdomen: Soft, non-tender, non-distended with normoactive bowel sounds. No hepatomegaly. No rebound/guarding. No obvious abdominal masses. Msk:  Strength and tone appear normal for age. Extremities: No clubbing or cyanosis. No edema. Distal pedal pulses are 2+ and equal bilaterally. Neuro: Alert and oriented X 3. No facial asymmetry. No focal deficit. Moves all extremities spontaneously. Psych:  Responds to questions appropriately with a normal affect.   EKG:  The EKG was personally reviewed and demonstrates:  NSR, 75 bpm, baseline wandering, overall poor quality EKG Telemetry:  Telemetry was personally reviewed and demonstrates:  NSR  Weights: [employer]   06/22/17 0006  Weight: 135 lb (61.2 kg)    Relevant CV Studies: TTE pending  Laboratory Data:  Chemistry  Recent Labs Lab 06/22/17 0019  NA 138  K 4.3  CL 103  CO2 24  GLUCOSE 236*  BUN 25*  CREATININE 0.95  CALCIUM 9.4  GFRNONAA 48*  GFRAA 55*  ANIONGAP 11     Recent Labs Lab 06/22/17 0019  PROT 7.7  ALBUMIN 4.1  AST 24  ALT 16    ALKPHOS 90  BILITOT 0.4   Hematology  Recent Labs Lab 06/22/17 0116  WBC 12.6*  RBC 3.82  HGB 11.9*  HCT 35.9  MCV 94.1  MCH 31.1  MCHC 33.0  RDW 13.9  PLT 179   Cardiac Enzymes  Recent Labs Lab 06/22/17 0019  TROPONINI <0.03  BNPNo results for input(s): BNP, PROBNP in the last 168 hours.  DDimer No results for input(s): DDIMER in the last 168 hours.  Radiology/Studies:  Ct Head Wo Contrast  Result Date: 06/22/2017 IMPRESSION: 1. Cerebral atrophy with chronic small vessel ischemia. No acute intracranial abnormality. 2. Chronic stable cervical spondylosis. No acute cervical spine fracture or posttraumatic subluxation. Electronically Signed   By: Tollie Eth M.D.   On: 06/22/2017 02:43   Ct Cervical Spine Wo Contrast  Result Date: 06/22/2017 IMPRESSION: 1. Cerebral atrophy with chronic small vessel ischemia. No acute intracranial abnormality. 2. Chronic stable cervical spondylosis. No acute cervical spine fracture or posttraumatic subluxation. Electronically Signed   By: Tollie Eth M.D.   On: 06/22/2017 02:43   Ct Lumbar Spine Wo Contrast  Result Date: 06/22/2017 IMPRESSION: 1. No acute fracture or dislocation. 2. Lumbar spondylosis with mild disc and severe facet degenerative changes. 3. Stable L4-5 grade 1 anterolisthesis. 4. Calcific atherosclerosis of abdominal aorta. Electronically Signed   By: Mitzi Hansen M.D.   On: 06/22/2017 02:26    Assessment and Plan:   1. Possible syncope: -Patient took Ambien 5 mg on the evening of 10/18. A short while after falling asleep she got up to use the restroom leading to a fall vs syncope -Never with any chest pain, SOB, or palpitations -Troponin negative x 1, no cycled, continue to cycle to rule out  -Tele has been normal -Echo pending -Recommend orthostatics -Would avoid Ambien, defer to IM -Could consider outpatient cardiac monitoring -Check magnesium   2. HTN: -Improved   3. Elevated TSH: -Per  IM   For questions or updates, please contact [facility] Please consult [email or web address] for contact info under  Cardiology/STEMI.   Signed, Eula Listen, PA-C [facility] Pager: [phone] 06/22/2017, 8:00 AM

## 2017-06-22 NOTE — Progress Notes (Signed)
PT Cancellation Note  Patient Details Name: Ladona Mowatalie G Perriello MRN: [id number] DOB: 02/03/1915   Cancelled Treatment:    Reason Eval/Treat Not Completed: Other (comment) Family states they will take pt home, do not need/want PT intervention.  Spoke with referring MD, PT orders will be completed.   Malachi ProGalen R Cintia Gleed, DPT 06/22/2017, 1:58 PM

## 2017-06-22 NOTE — Progress Notes (Signed)
Pt discharged to home. Discharge instructions and education reviewed with pt and daughter. Iv & heart monitor removed. Pt taken to vehicle via wheelchair.

## 2017-06-22 NOTE — ED Triage Notes (Signed)
Pt fell, striking head on desk. Pt is in independent living and had no way to contact anyone for help. It took her several hours to slide on her buttocks on the floor to a phone.

## 2017-06-22 NOTE — Progress Notes (Signed)
Inpatient Diabetes Program Recommendations  AACE/ADA: New Consensus Statement on Inpatient Glycemic Control (2015)  Target Ranges:  Prepandial:   less than 140 mg/dL      Peak postprandial:   less than 180 mg/dL (1-2 hours)      Critically ill patients:  140 - 180 mg/dL   Lab Results  Component Value Date   GLUCAP 203 (H) 06/22/2017    Review of Glycemic Control  Results for Ladona MowCERASANI, Brynlei G (MRN [id number]) as of 06/22/2017 13:02  Ref. Range 06/22/2017 09:45 06/22/2017 11:34  Glucose-Capillary Latest Ref Range: 65 - 99 mg/dL 782231 (H) 956203 (H)    Diabetes history: Type 2 Outpatient Diabetes medications: Amaryl 8mg  qday, Metformin 500mg  bid, Januvia 100mg  qday  Current orders for Inpatient glycemic control: Tradgenta 5mg  qday, Novolog 0-9 units tid, Novolog 0-5 units qhs  Inpatient Diabetes Program Recommendations:    Per ADA recommendations "consider performing an A1C on all patients with diabetes or hyperglycemia admitted to the hospital if not performed in the prior 3 months".  Consider low dose basal insulin- Lantus 6 units qhs.  Do not recommend reordering Amaryl at discharge because of her renal function, her age and the high risk of hypoglycemia.   Susette RacerJulie Liberti Appleton, RN, BA, MHA, CDE Diabetes Coordinator Inpatient Diabetes Program  [phone] (Team Pager) [phone] [facility] Office) 06/22/2017 1:05 PM

## 2017-06-22 NOTE — Care Management Obs Status (Signed)
MEDICARE OBSERVATION STATUS NOTIFICATION   Patient Details  Name: Denise Frank MRN: [id number] Date of Birth: 06/04/1915   Medicare Observation Status Notification Given:  Yes    Gwenette GreetBrenda S Doni Widmer, RN 06/22/2017, 11:19 AM

## 2017-06-22 NOTE — H&P (Signed)
Denise Frank is an 83 y.o. female.   Chief Complaint: Fall HPI: The patient with past medical history of hypertension and diabetes presents to the emergency department after a fall. The patient does not recall the circumstances surrounding her injury however she sustained a significant head laceration which required multiple staples in the emergency department. CT of her head showed no acute intracranial process. The patient denies chest pain or shortness of breath. She admits to headache and low back pain. No arrhythmias noted on telemetry but due to the unknown circumstances of her fall emergency department staff asked the hospitalist service for further evaluation.  Past Medical History:  Diagnosis Date  . Diabetes mellitus without complication ([place])   . Hypertension     Past Surgical History:  Procedure Laterality Date  . none      Family History  Problem Relation Age of Onset  . CAD Neg Hx    Social History:  reports that she has never smoked. She has never used smokeless tobacco. She reports that she does not drink alcohol or use drugs.  Allergies:  Allergies  Allergen Reactions  . Demerol [Meperidine] Nausea And Vomiting    Medications Prior to Admission  Medication Sig Dispense Refill  . aspirin 81 MG chewable tablet Chew 81 mg by mouth daily.     . busPIRone (BUSPAR) 7.5 MG tablet Take 1 tablet by mouth 2 (two) times daily.    Marland Kitchen doxepin (SINEQUAN) 10 MG capsule Take 10 mg by mouth 2 (two) times daily.    . furosemide (LASIX) 20 MG tablet Take 20 mg by mouth daily as needed for fluid or edema.     Marland Kitchen glimepiride (AMARYL) 4 MG tablet Take 8 mg by mouth daily with breakfast.    . hydrOXYzine (ATARAX/VISTARIL) 25 MG tablet Take 1 mg by mouth 4 (four) times daily as needed for itching.     . isosorbide mononitrate (IMDUR) 30 MG 24 hr tablet Take 30 mg by mouth daily.    Marland Kitchen levothyroxine (SYNTHROID, LEVOTHROID) 100 MCG tablet Take 100 mcg by mouth daily before breakfast.     . Melatonin 3 MG TABS Take 3 mg by mouth at bedtime.    . metFORMIN (GLUCOPHAGE) 500 MG tablet Take 500 mg by mouth 2 (two) times daily.    . sertraline (ZOLOFT) 50 MG tablet Take 50 mg by mouth at bedtime.    . Simethicone 125 MG CAPS Take 125 mg by mouth daily as needed.     . sitaGLIPtin (JANUVIA) 100 MG tablet Take 100 mg by mouth daily.    Marland Kitchen zolpidem (AMBIEN) 10 MG tablet Take 10 mg by mouth at bedtime as needed for sleep.      Results for orders placed or performed during the hospital encounter of 06/21/17 (from the past 48 hour(s))  Comprehensive metabolic panel     Status: Abnormal   Collection Time: 06/22/17 12:19 AM  Result Value Ref Range   Sodium 138 135 - 145 mmol/L   Potassium 4.3 3.5 - 5.1 mmol/L   Chloride 103 101 - 111 mmol/L   CO2 24 22 - 32 mmol/L   Glucose, Bld 236 (H) 65 - 99 mg/dL   BUN 25 (H) 6 - 20 mg/dL   Creatinine, Ser 0.95 0.44 - 1.00 mg/dL   Calcium 9.4 8.9 - 10.3 mg/dL   Total Protein 7.7 6.5 - 8.1 g/dL   Albumin 4.1 3.5 - 5.0 g/dL   AST 24 15 - 41 U/L  ALT 16 14 - 54 U/L   Alkaline Phosphatase 90 38 - 126 U/L   Total Bilirubin 0.4 0.3 - 1.2 mg/dL   GFR calc non Af Amer 48 (L) >60 mL/min   GFR calc Af Amer 55 (L) >60 mL/min    Comment: (NOTE) The eGFR has been calculated using the CKD EPI equation. This calculation has not been validated in all clinical situations. eGFR's persistently <60 mL/min signify possible Chronic Kidney Disease.    Anion gap 11 5 - 15  Troponin I     Status: None   Collection Time: 06/22/17 12:19 AM  Result Value Ref Range   Troponin I <0.03 <0.03 ng/mL  CK     Status: None   Collection Time: 06/22/17 12:19 AM  Result Value Ref Range   Total CK 106 38 - 234 U/L  CBC     Status: Abnormal   Collection Time: 06/22/17  1:16 AM  Result Value Ref Range   WBC 12.6 (H) 3.6 - 11.0 K/uL   RBC 3.82 3.80 - 5.20 MIL/uL   Hemoglobin 11.9 (L) 12.0 - 16.0 g/dL   HCT 35.9 35.0 - 47.0 %   MCV 94.1 80.0 - 100.0 fL   MCH 31.1  26.0 - 34.0 pg   MCHC 33.0 32.0 - 36.0 g/dL   RDW 13.9 11.5 - 14.5 %   Platelets 179 150 - 440 K/uL   Ct Head Wo Contrast  Result Date: 06/22/2017 CLINICAL DATA:  Patient struck head against the desk. EXAM: CT HEAD WITHOUT CONTRAST CT CERVICAL SPINE WITHOUT CONTRAST TECHNIQUE: Multidetector CT imaging of the head and cervical spine was performed following the standard protocol without intravenous contrast. Multiplanar CT image reconstructions of the cervical spine were also generated. COMPARISON:  11/26/2015 FINDINGS: Brain: Superficial and central atrophy appears stable. Chronic small vessel ischemic disease of periventricular white matter. No acute intracranial hemorrhage, midline shift or edema. No intra-axial mass nor extra-axial fluid collections. Vascular: Moderate atherosclerosis of the carotid siphons. Skull: No acute skull fracture. Right occipital scalp calcification likely related to old remote cephalohematoma. Sinuses/Orbits: Bilateral scleral bands. No acute sinus disease. Clear mastoids. Other: Bandaging projects over the occiput. CT CERVICAL SPINE FINDINGS Alignment: Maintained cervical lordosis. Skull base and vertebrae: No acute cervical spine fracture. Bony bridging of the anterior arch of C1 with skullbase. Osteoarthritis of the atlantodental interval. Soft tissues and spinal canal: No prevertebral fluid or swelling. No visible canal hematoma. Disc levels: Multilevel degenerative disc disease and facet arthropathy consistent cervical spondylosis most severely affecting C5-6. Resultant mild AP narrowing of the central canal from C3 through C5. Multilevel mild-to-moderate bilateral neural foraminal encroachment. Small disc-osteophyte complexes are noted from C3 through C6. Upper chest: Biapical pleuroparenchymal scarring and fibrosis. Other:  None IMPRESSION: 1. Cerebral atrophy with chronic small vessel ischemia. No acute intracranial abnormality. 2. Chronic stable cervical spondylosis. No  acute cervical spine fracture or posttraumatic subluxation. Electronically Signed   By: Ashley Royalty M.D.   On: 06/22/2017 02:43   Ct Cervical Spine Wo Contrast  Result Date: 06/22/2017 CLINICAL DATA:  Patient struck head against the desk. EXAM: CT HEAD WITHOUT CONTRAST CT CERVICAL SPINE WITHOUT CONTRAST TECHNIQUE: Multidetector CT imaging of the head and cervical spine was performed following the standard protocol without intravenous contrast. Multiplanar CT image reconstructions of the cervical spine were also generated. COMPARISON:  11/26/2015 FINDINGS: Brain: Superficial and central atrophy appears stable. Chronic small vessel ischemic disease of periventricular white matter. No acute intracranial hemorrhage, midline shift or  edema. No intra-axial mass nor extra-axial fluid collections. Vascular: Moderate atherosclerosis of the carotid siphons. Skull: No acute skull fracture. Right occipital scalp calcification likely related to old remote cephalohematoma. Sinuses/Orbits: Bilateral scleral bands. No acute sinus disease. Clear mastoids. Other: Bandaging projects over the occiput. CT CERVICAL SPINE FINDINGS Alignment: Maintained cervical lordosis. Skull base and vertebrae: No acute cervical spine fracture. Bony bridging of the anterior arch of C1 with skullbase. Osteoarthritis of the atlantodental interval. Soft tissues and spinal canal: No prevertebral fluid or swelling. No visible canal hematoma. Disc levels: Multilevel degenerative disc disease and facet arthropathy consistent cervical spondylosis most severely affecting C5-6. Resultant mild AP narrowing of the central canal from C3 through C5. Multilevel mild-to-moderate bilateral neural foraminal encroachment. Small disc-osteophyte complexes are noted from C3 through C6. Upper chest: Biapical pleuroparenchymal scarring and fibrosis. Other:  None IMPRESSION: 1. Cerebral atrophy with chronic small vessel ischemia. No acute intracranial abnormality. 2.  Chronic stable cervical spondylosis. No acute cervical spine fracture or posttraumatic subluxation. Electronically Signed   By: Ashley Royalty M.D.   On: 06/22/2017 02:43   Ct Lumbar Spine Wo Contrast  Result Date: 06/22/2017 CLINICAL DATA:  83 y/o  F; status post fall with lower back pain. EXAM: CT LUMBAR SPINE WITHOUT CONTRAST TECHNIQUE: Multidetector CT imaging of the lumbar spine was performed without intravenous contrast administration. Multiplanar CT image reconstructions were also generated. COMPARISON:  10/08/2009 MRI of the lumbar spine. FINDINGS: Segmentation: 5 lumbar type vertebrae. Alignment: Stable L4-5 grade 1 anterolisthesis. Vertebrae: No acute fracture or focal pathologic process. Paraspinal and other soft tissues: Calcific atherosclerosis of the abdominal aorta. Disc levels: Multilevel mild disc space narrowing intervertebral disc calcification. Severe facet arthropathy. Reticulation of L4-5 spinous processes with degenerative changes compatible with Baastrup's disease. IMPRESSION: 1. No acute fracture or dislocation. 2. Lumbar spondylosis with mild disc and severe facet degenerative changes. 3. Stable L4-5 grade 1 anterolisthesis. 4. Calcific atherosclerosis of abdominal aorta. Electronically Signed   By: Kristine Garbe M.D.   On: 06/22/2017 02:26    Review of Systems  Constitutional: Negative for chills and fever.  HENT: Negative for sore throat and tinnitus.   Eyes: Negative for blurred vision and redness.  Respiratory: Negative for cough and shortness of breath.   Cardiovascular: Negative for chest pain, palpitations, orthopnea and PND.  Gastrointestinal: Negative for abdominal pain, diarrhea, nausea and vomiting.  Genitourinary: Negative for dysuria, frequency and urgency.  Musculoskeletal: Positive for back pain. Negative for joint pain and myalgias.  Skin: Negative for rash.       No lesions  Neurological: Positive for headaches. Negative for speech change, focal  weakness and weakness.  Endo/Heme/Allergies: Does not bruise/bleed easily.       No temperature intolerance  Psychiatric/Behavioral: Negative for depression and suicidal ideas.    Blood pressure 137/63, pulse 64, temperature (!) 97.4 F (36.3 C), temperature source Oral, resp. rate 12, height 5' 3"  (1.6 m), weight 61.2 kg (135 lb), SpO2 98 %. Physical Exam  Vitals reviewed. Constitutional: She is oriented to person, place, and time. She appears well-developed and well-nourished. No distress.  HENT:  Head: Normocephalic.  Mouth/Throat: Oropharynx is clear and moist.  Head laceration  Eyes: Pupils are equal, round, and reactive to light. Conjunctivae and EOM are normal. No scleral icterus.  Neck: Normal range of motion. Neck supple. No JVD present. No tracheal deviation present. No thyromegaly present.  Cardiovascular: Normal rate, regular rhythm and normal heart sounds.  Exam reveals no gallop and no friction rub.  No murmur heard. Respiratory: Effort normal and breath sounds normal.  GI: Soft. Bowel sounds are normal. She exhibits no distension. There is no tenderness.  Genitourinary:  Genitourinary Comments: Deferred  Musculoskeletal: Normal range of motion. She exhibits no edema.  Lymphadenopathy:    She has no cervical adenopathy.  Neurological: She is alert and oriented to person, place, and time. No cranial nerve deficit. She exhibits normal muscle tone.  Skin: Skin is warm and dry. No rash noted. No erythema.  Psychiatric: She has a normal mood and affect. Her behavior is normal. Judgment and thought content normal.     Assessment/Plan His is a 83 year old female admitted for syncope. 1. Syncope: And/or concussion. The patient has no recollection of the events surrounding her fall. Monitor telemetry. Obtain echocardiogram. Consult cardiology. 2. Hypertension: The patient has had readings that are grossly uncontrolled. She received clonidine in the emergency department. She  takes Imdur at home which we may continue here. Etiology of her falls may be secondary to severe hypertension or dramatic drops in blood pressure due to medication.  3. Diabetes mellitus type 2: Hold oral hypoglycemic agents except Tradjenta. Sliding scale insulin while hospitalized 4. Low back pain: Morphine as needed for severe pain. 5. Hypothyroidism: Continue Synthroid. Check TSH 6. Depression: Continue doxepin, sertraline and BuSpar 7. Scalp laceration: Hemostasis achieved. Consider oral antibiotics due to extent of the wound. 8. DVT prophylaxis: SCDs 9. GI prophylaxis: None The patient is a DO NOT RESUSCITATE. Time spent on admission orders and patient care approximately 45 minutes  Harrie Foreman, MD 06/22/2017, 7:11 AM

## 2017-06-22 NOTE — ED Notes (Signed)
External catheter placed.

## 2017-06-23 NOTE — Discharge Summary (Signed)
[facility] - [place] at [facility]   PATIENT NAME: Denise Songsteratalie Frank    MR#:  [id number]  DATE OF BIRTH:  07/10/1915  DATE OF ADMISSION:  06/21/2017 ADMITTING PHYSICIAN: Arnaldo NatalMichael S Diamond, MD  DATE OF DISCHARGE: 06/22/2017  3:15 PM  PRIMARY CARE PHYSICIAN: Marguarite ArbourSparks, Jeffrey D, MD    ADMISSION DIAGNOSIS:  Syncope, unspecified syncope type [R55]  DISCHARGE DIAGNOSIS:  Principal Problem:   Fall Active Problems:   Syncope   SECONDARY DIAGNOSIS:   Past Medical History:  Diagnosis Date  . Diabetes mellitus without complication (HCC)   . Hypertension     HOSPITAL COURSE:   His is a 83 year old female admitted for syncope. 1. Syncope: And/or concussion. The patient has no recollection of the events surrounding her fall. Monitor telemetry. Obtain echocardiogram. Consult cardiology.  serial troponin negative, EKG and tele without arrhythmia.   Pt had taken ambien in night of fall. So daughter suspect that may be the reason, and as she was feeling fine- requested - no further work ups due to her age and take her back home.   I agree with this due to her age. 2. Hypertension: The patient has had readings that are grossly uncontrolled. She received clonidine in the emergency department. She takes Imdur at home which we may continue here. Etiology of her falls may be secondary to severe hypertension or dramatic drops in blood pressure due to medication.   Bp was stable after came in room, may be due to anxiety - in ER. 3. Diabetes mellitus type 2: Hold oral hypoglycemic agents except Tradjenta. Sliding scale insulin while hospitalized 4. Low back pain: Morphine as needed for severe pain. 5. Hypothyroidism: Continue Synthroid. Check TSH 6. Depression: Continue doxepin, sertraline and BuSpar 7. Scalp laceration: Hemostasis achieved. Consider oral antibiotics due to extent of the wound. 8. DVT prophylaxis: SCDs 9. GI prophylaxis: None  DISCHARGE CONDITIONS:    Stable.  CONSULTS OBTAINED:  Treatment Team:  Iran OuchArida, Muhammad A, MD  DRUG ALLERGIES:   Allergies  Allergen Reactions  . Demerol [Meperidine] Nausea And Vomiting    DISCHARGE MEDICATIONS:   Discharge Medication List as of 06/22/2017  2:18 PM    CONTINUE these medications which have NOT CHANGED   Details  aspirin 81 MG chewable tablet Chew 81 mg by mouth daily. , Historical Med    busPIRone (BUSPAR) 7.5 MG tablet Take 1 tablet by mouth 2 (two) times daily., Starting Tue 01/30/2017, Historical Med    doxepin (SINEQUAN) 10 MG capsule Take 10 mg by mouth 2 (two) times daily., Historical Med    furosemide (LASIX) 20 MG tablet Take 20 mg by mouth daily as needed for fluid or edema. , Historical Med    glimepiride (AMARYL) 4 MG tablet Take 8 mg by mouth daily with breakfast., Historical Med    hydrOXYzine (ATARAX/VISTARIL) 25 MG tablet Take 1 mg by mouth 4 (four) times daily as needed for itching. , Starting Mon 03/26/2017, Historical Med    isosorbide mononitrate (IMDUR) 30 MG 24 hr tablet Take 30 mg by mouth daily., Historical Med    levothyroxine (SYNTHROID, LEVOTHROID) 100 MCG tablet Take 100 mcg by mouth daily before breakfast., Historical Med    Melatonin 3 MG TABS Take 3 mg by mouth at bedtime., Historical Med    metFORMIN (GLUCOPHAGE) 500 MG tablet Take 500 mg by mouth 2 (two) times daily., Starting Thu 01/18/2017, Until Fri 01/18/2018, Historical Med    sertraline (ZOLOFT) 50 MG tablet Take 50 mg  by mouth at bedtime., Historical Med    Simethicone 125 MG CAPS Take 125 mg by mouth daily as needed. , Historical Med    sitaGLIPtin (JANUVIA) 100 MG tablet Take 100 mg by mouth daily., Historical Med    zolpidem (AMBIEN) 10 MG tablet Take 10 mg by mouth at bedtime as needed for sleep., Historical Med         DISCHARGE INSTRUCTIONS:    Follow with PMD as needed.  If you experience worsening of your admission symptoms, develop shortness of breath, life threatening  emergency, suicidal or homicidal thoughts you must seek medical attention immediately by calling 911 or calling your MD immediately  if symptoms less severe.  You Must read complete instructions/literature along with all the possible adverse reactions/side effects for all the Medicines you take and that have been prescribed to you. Take any new Medicines after you have completely understood and accept all the possible adverse reactions/side effects.   Please note  You were cared for by a hospitalist during your hospital stay. If you have any questions about your discharge medications or the care you received while you were in the hospital after you are discharged, you can call the unit and asked to speak with the hospitalist on call if the hospitalist that took care of you is not available. Once you are discharged, your primary care physician will handle any further medical issues. Please note that NO REFILLS for any discharge medications will be authorized once you are discharged, as it is imperative that you return to your primary care physician (or establish a relationship with a primary care physician if you do not have one) for your aftercare needs so that they can reassess your need for medications and monitor your lab values.    Today   CHIEF COMPLAINT:   Chief Complaint  Patient presents with  . Fall  . Head Injury    HISTORY OF PRESENT ILLNESS:  Denise Frank  is a 83 y.o. female with a known history of hypertension and diabetes presents to the emergency department after a fall. The patient does not recall the circumstances surrounding her injury however she sustained a significant head laceration which required multiple staples in the emergency department. CT of her head showed no acute intracranial process. The patient denies chest pain or shortness of breath. She admits to headache and low back pain. No arrhythmias noted on telemetry but due to the unknown circumstances of her  fall emergency department staff asked the hospitalist service for further evaluation.  VITAL SIGNS:  Blood pressure (!) 128/54, pulse 60, temperature 98.4 F (36.9 C), temperature source Oral, resp. rate 12, height 5\' 3"  (1.6 m), weight 61.2 kg (135 lb), SpO2 97 %.  I/O:  No intake or output data in the 24 hours ending 06/23/17 1437  PHYSICAL EXAMINATION:   Constitutional: She is oriented to person, place, and time. She appears well-developed and well-nourished. No distress.  HENT:  Head: Normocephalic.  Mouth/Throat: Oropharynx is clear and moist.  Head laceration  Eyes: Pupils are equal, round, and reactive to light. Conjunctivae and EOM are normal. No scleral icterus.  Neck: Normal range of motion. Neck supple. No JVD present. No tracheal deviation present. No thyromegaly present.  Cardiovascular: Normal rate, regular rhythm and normal heart sounds.  Exam reveals no gallop and no friction rub.   No murmur heard. Respiratory: Effort normal and breath sounds normal.  GI: Soft. Bowel sounds are normal. She exhibits no distension. There is no  tenderness.  Genitourinary:  Genitourinary Comments: Deferred  Musculoskeletal: Normal range of motion. She exhibits no edema.  Lymphadenopathy:    She has no cervical adenopathy.  Neurological: She is alert and oriented to person, place, and time. No cranial nerve deficit. She exhibits normal muscle tone.  Skin: Skin is warm and dry. No rash noted. No erythema.  Psychiatric: She has a normal mood and affect. Her behavior is normal. Judgment and thought content normal.   DATA REVIEW:   CBC  Recent Labs Lab 06/22/17 0116  WBC 12.6*  HGB 11.9*  HCT 35.9  PLT 179    Chemistries   Recent Labs Lab 06/22/17 0019 06/22/17 0119  NA 138  --   K 4.3  --   CL 103  --   CO2 24  --   GLUCOSE 236*  --   BUN 25*  --   CREATININE 0.95  --   CALCIUM 9.4  --   MG  --  2.0  AST 24  --   ALT 16  --   ALKPHOS 90  --   BILITOT 0.4  --      Cardiac Enzymes  Recent Labs Lab 06/22/17 0019  TROPONINI <0.03    Microbiology Results  Results for orders placed or performed during the hospital encounter of 11/26/15  Urine culture     Status: None   Collection Time: 11/26/15  8:55 PM  Result Value Ref Range Status   Specimen Description URINE, RANDOM  Final   Special Requests NONE  Final   Culture MULTIPLE SPECIES PRESENT, SUGGEST RECOLLECTION  Final   Report Status 11/28/2015 FINAL  Final    RADIOLOGY:  Ct Head Wo Contrast  Result Date: 06/22/2017 CLINICAL DATA:  Patient struck head against the desk. EXAM: CT HEAD WITHOUT CONTRAST CT CERVICAL SPINE WITHOUT CONTRAST TECHNIQUE: Multidetector CT imaging of the head and cervical spine was performed following the standard protocol without intravenous contrast. Multiplanar CT image reconstructions of the cervical spine were also generated. COMPARISON:  11/26/2015 FINDINGS: Brain: Superficial and central atrophy appears stable. Chronic small vessel ischemic disease of periventricular white matter. No acute intracranial hemorrhage, midline shift or edema. No intra-axial mass nor extra-axial fluid collections. Vascular: Moderate atherosclerosis of the carotid siphons. Skull: No acute skull fracture. Right occipital scalp calcification likely related to old remote cephalohematoma. Sinuses/Orbits: Bilateral scleral bands. No acute sinus disease. Clear mastoids. Other: Bandaging projects over the occiput. CT CERVICAL SPINE FINDINGS Alignment: Maintained cervical lordosis. Skull base and vertebrae: No acute cervical spine fracture. Bony bridging of the anterior arch of C1 with skullbase. Osteoarthritis of the atlantodental interval. Soft tissues and spinal canal: No prevertebral fluid or swelling. No visible canal hematoma. Disc levels: Multilevel degenerative disc disease and facet arthropathy consistent cervical spondylosis most severely affecting C5-6. Resultant mild AP narrowing of the  central canal from C3 through C5. Multilevel mild-to-moderate bilateral neural foraminal encroachment. Small disc-osteophyte complexes are noted from C3 through C6. Upper chest: Biapical pleuroparenchymal scarring and fibrosis. Other:  None IMPRESSION: 1. Cerebral atrophy with chronic small vessel ischemia. No acute intracranial abnormality. 2. Chronic stable cervical spondylosis. No acute cervical spine fracture or posttraumatic subluxation. Electronically Signed   By: Tollie Eth M.D.   On: 06/22/2017 02:43   Ct Cervical Spine Wo Contrast  Result Date: 06/22/2017 CLINICAL DATA:  Patient struck head against the desk. EXAM: CT HEAD WITHOUT CONTRAST CT CERVICAL SPINE WITHOUT CONTRAST TECHNIQUE: Multidetector CT imaging of the head and cervical spine was performed  following the standard protocol without intravenous contrast. Multiplanar CT image reconstructions of the cervical spine were also generated. COMPARISON:  11/26/2015 FINDINGS: Brain: Superficial and central atrophy appears stable. Chronic small vessel ischemic disease of periventricular white matter. No acute intracranial hemorrhage, midline shift or edema. No intra-axial mass nor extra-axial fluid collections. Vascular: Moderate atherosclerosis of the carotid siphons. Skull: No acute skull fracture. Right occipital scalp calcification likely related to old remote cephalohematoma. Sinuses/Orbits: Bilateral scleral bands. No acute sinus disease. Clear mastoids. Other: Bandaging projects over the occiput. CT CERVICAL SPINE FINDINGS Alignment: Maintained cervical lordosis. Skull base and vertebrae: No acute cervical spine fracture. Bony bridging of the anterior arch of C1 with skullbase. Osteoarthritis of the atlantodental interval. Soft tissues and spinal canal: No prevertebral fluid or swelling. No visible canal hematoma. Disc levels: Multilevel degenerative disc disease and facet arthropathy consistent cervical spondylosis most severely affecting C5-6.  Resultant mild AP narrowing of the central canal from C3 through C5. Multilevel mild-to-moderate bilateral neural foraminal encroachment. Small disc-osteophyte complexes are noted from C3 through C6. Upper chest: Biapical pleuroparenchymal scarring and fibrosis. Other:  None IMPRESSION: 1. Cerebral atrophy with chronic small vessel ischemia. No acute intracranial abnormality. 2. Chronic stable cervical spondylosis. No acute cervical spine fracture or posttraumatic subluxation. Electronically Signed   By: Tollie Eth M.D.   On: 06/22/2017 02:43   Ct Lumbar Spine Wo Contrast  Result Date: 06/22/2017 CLINICAL DATA:  83 y/o  F; status post fall with lower back pain. EXAM: CT LUMBAR SPINE WITHOUT CONTRAST TECHNIQUE: Multidetector CT imaging of the lumbar spine was performed without intravenous contrast administration. Multiplanar CT image reconstructions were also generated. COMPARISON:  10/08/2009 MRI of the lumbar spine. FINDINGS: Segmentation: 5 lumbar type vertebrae. Alignment: Stable L4-5 grade 1 anterolisthesis. Vertebrae: No acute fracture or focal pathologic process. Paraspinal and other soft tissues: Calcific atherosclerosis of the abdominal aorta. Disc levels: Multilevel mild disc space narrowing intervertebral disc calcification. Severe facet arthropathy. Reticulation of L4-5 spinous processes with degenerative changes compatible with Baastrup's disease. IMPRESSION: 1. No acute fracture or dislocation. 2. Lumbar spondylosis with mild disc and severe facet degenerative changes. 3. Stable L4-5 grade 1 anterolisthesis. 4. Calcific atherosclerosis of abdominal aorta. Electronically Signed   By: Mitzi Hansen M.D.   On: 06/22/2017 02:26    EKG:   Orders placed or performed during the hospital encounter of 06/21/17  . EKG 12-Lead  . EKG 12-Lead  . ED EKG  . ED EKG      Management plans discussed with the patient, family and they are in agreement.  CODE STATUS:  Code Status History     Date Active Date Inactive Code Status Order ID Comments User Context   06/22/2017  6:30 AM 06/22/2017  6:20 PM DNR [id number]  Arnaldo Natal, MD Inpatient   06/22/2017  5:52 AM 06/22/2017  6:30 AM Full Code [id number]  Arnaldo Natal, MD ED    Questions for Most Recent Historical Code Status (Order [id number])    Question Answer Comment   In the event of cardiac or respiratory ARREST Do not call a "code blue"    In the event of cardiac or respiratory ARREST Do not perform Intubation, CPR, defibrillation or ACLS    In the event of cardiac or respiratory ARREST Use medication by any route, position, wound care, and other measures to relive pain and suffering. May use oxygen, suction and manual treatment of airway obstruction as needed for comfort.  Advance Directive Documentation     Most Recent Value  Type of Advance Directive  Out of facility DNR (pink MOST or yellow form), Living will  Pre-existing out of facility DNR order (yellow form or pink MOST form)  Yellow form placed in chart (order not valid for inpatient use)  "MOST" Form in Place?  -      TOTAL TIME TAKING CARE OF THIS PATIENT: 35 minutes.    Altamese Dilling M.D on 06/23/2017 at 2:37 PM  Between 7am to 6pm - Pager - [phone]  After 6pm go to [email or web address] - password [email or web address]  [facility]  Office  [phone]  CC: Primary care physician; Marguarite Arbour, MD   Note: This dictation was prepared with Dragon dictation along with smaller phrase technology. Any transcriptional errors that result from this process are unintentional.

## 2017-07-03 NOTE — ED Provider Notes (Signed)
[facility] Emergency Department Provider Note    First MD Initiated Contact with Patient 06/22/17 0000     (approximate)  I have reviewed the triage vital signs and the nursing notes.   HISTORY  Chief Complaint Fall and Head Injury    HPI Denise Frank is a 83 y.o. female presents to the emergency department following the circumstances surrounding her fall she denies slipping or tripping. Patient admits to 7 out of 10 occipital head pain. Patient denies any chest pain or shortness of breath. Patient denies any recent illness no vomiting or diarrhea. Patient also admits to lumbar spine pain following the fall as well.patient denies any weakness numbness or visual changes.  Past Medical History:  Diagnosis Date  . Diabetes mellitus without complication (HCC)   . Hypertension     Patient Active Problem List   Diagnosis Date Noted  . Syncope 06/22/2017  . Fall 06/22/2017    Past Surgical History:  Procedure Laterality Date  . none      Prior to Admission medications   Medication Sig Start Date End Date Taking? Authorizing Provider  aspirin 81 MG chewable tablet Chew 81 mg by mouth daily.    Yes [provider]  busPIRone (BUSPAR) 7.5 MG tablet Take 1 tablet by mouth 2 (two) times daily. 01/30/17  Yes [provider]  doxepin (SINEQUAN) 10 MG capsule Take 10 mg by mouth 2 (two) times daily.   Yes [provider]  furosemide (LASIX) 20 MG tablet Take 20 mg by mouth daily as needed for fluid or edema.    Yes [provider]  glimepiride (AMARYL) 4 MG tablet Take 8 mg by mouth daily with breakfast.   Yes [provider]  hydrOXYzine (ATARAX/VISTARIL) 25 MG tablet Take 1 mg by mouth 4 (four) times daily as needed for itching.  03/26/17  Yes [provider]  isosorbide mononitrate (IMDUR) 30 MG 24 hr tablet Take 30 mg by mouth daily.   Yes [provider]  levothyroxine (SYNTHROID,  LEVOTHROID) 100 MCG tablet Take 100 mcg by mouth daily before breakfast.   Yes [provider]  Melatonin 3 MG TABS Take 3 mg by mouth at bedtime.   Yes [provider]  metFORMIN (GLUCOPHAGE) 500 MG tablet Take 500 mg by mouth 2 (two) times daily. 01/18/17 01/18/18 Yes [provider]  sertraline (ZOLOFT) 50 MG tablet Take 50 mg by mouth at bedtime.   Yes [provider]  Simethicone 125 MG CAPS Take 125 mg by mouth daily as needed.    Yes [provider]  sitaGLIPtin (JANUVIA) 100 MG tablet Take 100 mg by mouth daily.   Yes [provider]  zolpidem (AMBIEN) 10 MG tablet Take 10 mg by mouth at bedtime as needed for sleep.   Yes [provider]    Allergies Demerol [meperidine]  Family History  Problem Relation Age of Onset  . Hypertension Father   . Brain cancer Father   . Diabetes Mother   . CAD Neg Hx     Social History Social History  Substance Use Topics  . Smoking status: Never Smoker  . Smokeless tobacco: Never Used  . Alcohol use No    Review of Systems Constitutional: No fever/chills Eyes: No visual changes. ENT: No sore throat. Cardiovascular: Denies chest pain. Respiratory: Denies shortness of breath. Gastrointestinal: No abdominal pain.  No nausea, no vomiting.  No diarrhea.  No constipation. Genitourinary: Negative for dysuria. Musculoskeletal: Negative  for neck pain.  Positive for back pain. Integumentary: Negative for rash.positive for scalp laceration Neurological: positive for for headaches,negative for focal weakness or numbness.  ____________________________________________   PHYSICAL EXAM:  VITAL SIGNS: ED Triage Vitals  Enc Vitals Group     BP 06/22/17 0011 (!) 233/110     Pulse Rate 06/22/17 0005 78     Resp 06/22/17 0005 20     Temp 06/22/17 0011 (!) 97.4 F (36.3 C)     Temp Source 06/22/17 0011 Oral     SpO2 06/22/17 0001 98 %     Weight 06/22/17 0006 61.2 kg (135 lb)      Height 06/22/17 0006 1.6 m (5\' 3" )     Head Circumference --      Peak Flow --      Pain Score 06/22/17 0004 9     Pain Loc --      Pain Edu? --      Excl. in GC? --     Constitutional: Alert and oriented. apparent discomfort Eyes: Conjunctivae are normal. PERRL. EOMI. Head:10 cm linear scalp occipital laceration Ears:  Healthy appearing ear canals and TMs bilaterally Nose: No congestion/rhinnorhea. Mouth/Throat: Mucous membranes are moist. Oropharynx non-erythematous. Neck: No stridor.  No cervical spine tenderness to palpation. Cardiovascular: Normal rate, regular rhythm. Good peripheral circulation. Grossly normal heart sounds. Respiratory: Normal respiratory effort.  No retractions. Lungs CTAB. Gastrointestinal: Soft and nontender. No distention.   Musculoskeletal: No lower extremity tenderness nor edema. No gross deformities of extremities. Neurologic:  Normal speech and language. No gross focal neurologic deficits are appreciated.  Skin:10 cm linear scalp laceration Psychiatric: Mood and affect are normal. Speech and behavior are normal.  ____________________________________________   LABS (all labs ordered are listed, but only abnormal results are displayed)  Labs Reviewed  COMPREHENSIVE METABOLIC PANEL - Abnormal; Notable for the following:       Result Value   Glucose, Bld 236 (*)    BUN 25 (*)    GFR calc non Af Amer 48 (*)    GFR calc Af Amer 55 (*)    All other components within normal limits  CBC - Abnormal; Notable for the following:    WBC 12.6 (*)    Hemoglobin 11.9 (*)    All other components within normal limits  TSH - Abnormal; Notable for the following:    TSH 8.824 (*)    All other components within normal limits  GLUCOSE, CAPILLARY - Abnormal; Notable for the following:    Glucose-Capillary 231 (*)    All other components within normal limits  GLUCOSE, CAPILLARY - Abnormal; Notable for the following:    Glucose-Capillary 203 (*)    All other  components within normal limits  TROPONIN I  CK  MAGNESIUM   ____________________________________________  EKG  ED ECG REPORT I, [place] N BROWN, the attending physician, personally viewed and interpreted this ECG.   Date: 07/04/2017  EKG Time: 12:09 AM  Rate: 79  Rhythm: normal sinus rhythm  Axis: Normal  Intervals:Normal  ST&T Change: None  ____________________________________________  RADIOLOGY I, [place] N BROWN, personally viewed and evaluated these images (plain radiographs) as part of my medical decision making, as well as reviewing the written report by the radiologist.   CLINICAL DATA:  Patient struck head against the desk.  EXAM: CT HEAD WITHOUT CONTRAST  CT CERVICAL SPINE WITHOUT CONTRAST  TECHNIQUE: Multidetector CT imaging of the head and cervical spine was performed following the standard protocol without intravenous contrast.  Multiplanar CT image reconstructions of the cervical spine were also generated.  COMPARISON:  11/26/2015  FINDINGS: Brain: Superficial and central atrophy appears stable. Chronic small vessel ischemic disease of periventricular white matter. No acute intracranial hemorrhage, midline shift or edema. No intra-axial mass nor extra-axial fluid collections.  Vascular: Moderate atherosclerosis of the carotid siphons.  Skull: No acute skull fracture. Right occipital scalp calcification likely related to old remote cephalohematoma.  Sinuses/Orbits: Bilateral scleral bands. No acute sinus disease. Clear mastoids.  Other: Bandaging projects over the occiput.  CT CERVICAL SPINE FINDINGS  Alignment: Maintained cervical lordosis.  Skull base and vertebrae: No acute cervical spine fracture. Bony bridging of the anterior arch of C1 with skullbase. Osteoarthritis of the atlantodental interval.  Soft tissues and spinal canal: No prevertebral fluid or swelling. No visible canal hematoma.  Disc levels: Multilevel  degenerative disc disease and facet arthropathy consistent cervical spondylosis most severely affecting C5-6. Resultant mild AP narrowing of the central canal from C3 through C5. Multilevel mild-to-moderate bilateral neural foraminal encroachment. Small disc-osteophyte complexes are noted from C3 through C6.  Upper chest: Biapical pleuroparenchymal scarring and fibrosis.  Other:  None  IMPRESSION: 1. Cerebral atrophy with chronic small vessel ischemia. No acute intracranial abnormality. 2. Chronic stable cervical spondylosis. No acute cervical spine fracture or posttraumatic subluxation.   Electronically Signed   By: Tollie Eth M.D.   On: 06/22/2017 02:43  .Marland KitchenLaceration Repair Date/Time: 07/04/2017 8:45 AM Performed by: Darci Current Authorized by: Darci Current   Consent:    Consent obtained:  Verbal   Consent given by:  Patient   Risks discussed:  Pain and poor cosmetic result Anesthesia (see MAR for exact dosages):    Anesthesia method:  Local infiltration   Local anesthetic:  Lidocaine 1% w/o epi Laceration details:    Location:  Scalp   Length (cm):  10   Depth (mm):  10 Repair type:    Repair type:  Simple Pre-procedure details:    Preparation:  Patient was prepped and draped in usual sterile fashion Exploration:    Contaminated: no   Treatment:    Area cleansed with:  Betadine   Amount of cleaning:  Standard   Visualized foreign bodies/material removed: no   Skin repair:    Repair method:  Staples   Number of staples:  12 Approximation:    Approximation:  Close   Vermilion border: well-aligned   Post-procedure details:    Dressing:  Tube gauze   Patient tolerance of procedure:  Tolerated well, no immediate complications     ____________________________________________   INITIAL IMPRESSION / ASSESSMENT AND PLAN / ED COURSE  As part of my medical decision making, I reviewed the following data within the electronic MEDICAL RECORD NUMBER  89 year oldwith above stated history of physical exam concern for syncopal episode with resultant skull laceration.     ____________________________________________  FINAL CLINICAL IMPRESSION(S) / ED DIAGNOSES  Final diagnoses:  Syncope, unspecified syncope type  scalp laceration   MEDICATIONS GIVEN DURING THIS VISIT:  Medications  morphine 2 MG/ML injection 2 mg (2 mg Intravenous Given 06/22/17 0050)  ondansetron (ZOFRAN) injection 4 mg (4 mg Intravenous Given 06/22/17 0050)  lidocaine (PF) (XYLOCAINE) 1 % injection 5 mL (5 mLs Intradermal Given 06/22/17 0242)  morphine 2 MG/ML injection 2 mg (2 mg Intravenous Given 06/22/17 0242)  ondansetron (ZOFRAN) injection 4 mg (4 mg Intravenous Given 06/22/17 0249)     NEW OUTPATIENT MEDICATIONS STARTED DURING THIS VISIT:  Discharge Medication  List as of 06/22/2017  2:18 PM      Discharge Medication List as of 06/22/2017  2:18 PM      Discharge Medication List as of 06/22/2017  2:18 PM       Note:  This document was prepared using Dragon voice recognition software and may include unintentional dictation errors.    Darci Current, MD 07/04/17 [phone]

## 2018-02-28 IMAGING — CT CT L SPINE W/O CM
3 series · 12 of 33 positions shown, 14 images · non-contrast
Comparison: 10/08/2009 MRI of the lumbar spine.

CLINICAL DATA: [AGE]/o  F; status post fall with lower back pain.

EXAM:
CT LUMBAR SPINE WITHOUT CONTRAST
TECHNIQUE: Multidetector CT imaging of the lumbar spine was performed without
intravenous contrast administration. Multiplanar CT image
reconstructions were also generated.

[Series 5: l spine soft · axial · 0.30mm/px · z∈[-628,-486]mm · 4 of 103 slices shown, 5 images]
[im 16/103  soft-tissue]
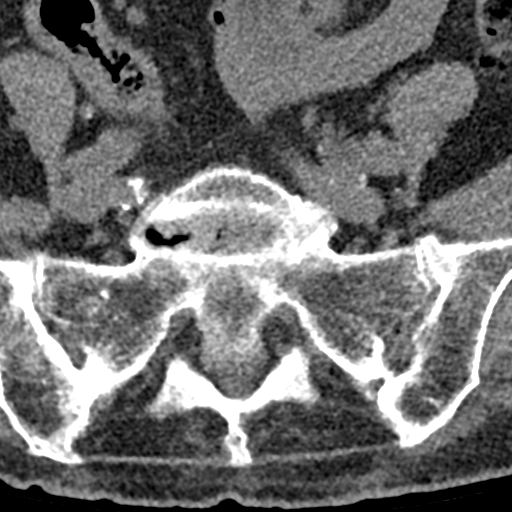
[im 16/103  bone]
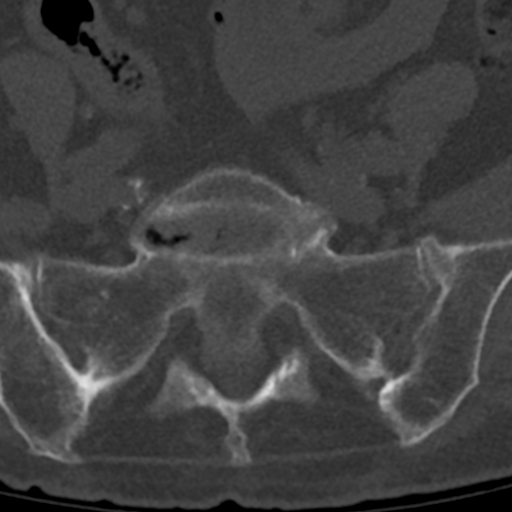
[im 40/103  bone]
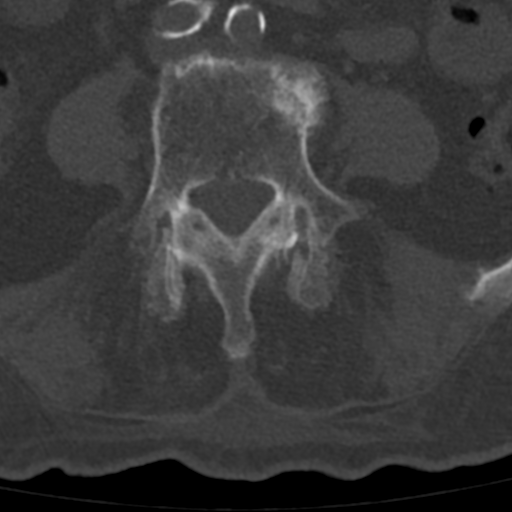
[im 63/103  bone]
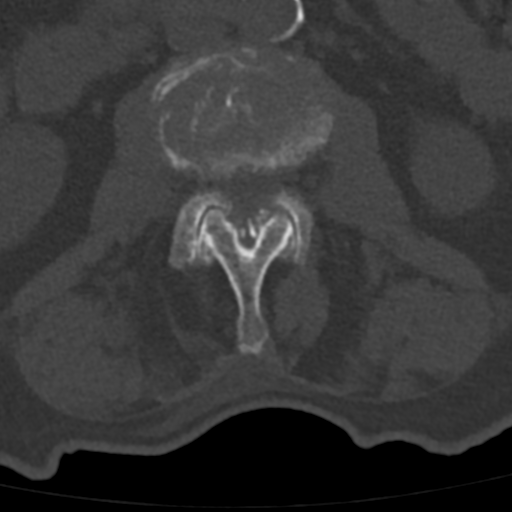
[im 87/103  bone]
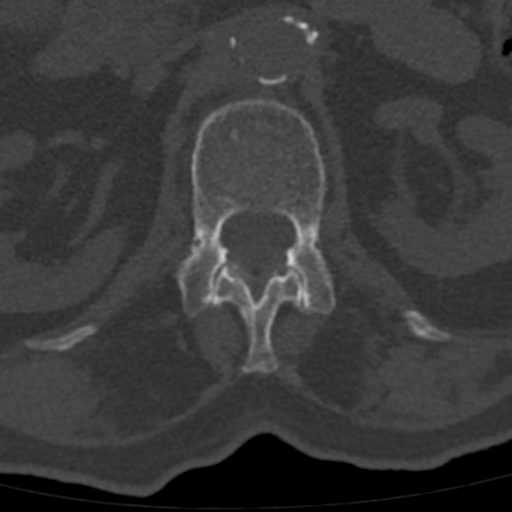

[Series 6: sagittal bone · sagittal · 0.30mm/px · 5 of 57 slices shown, 6 images]
[im 19/57  bone]
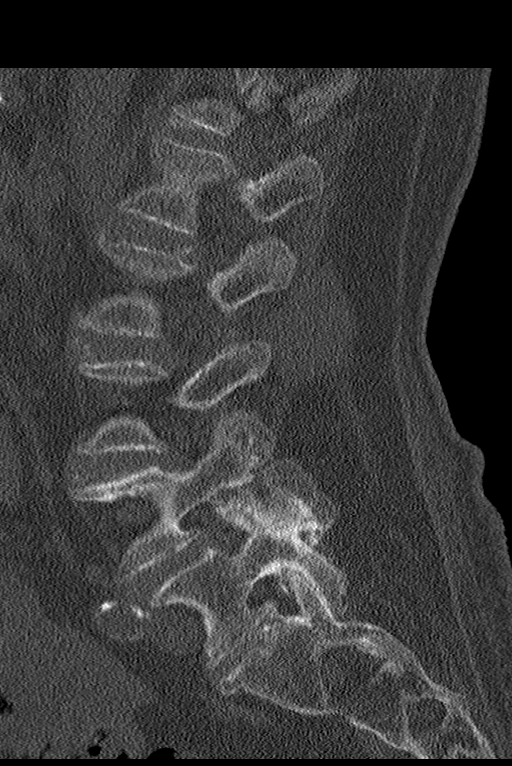
[im 24/57  bone]
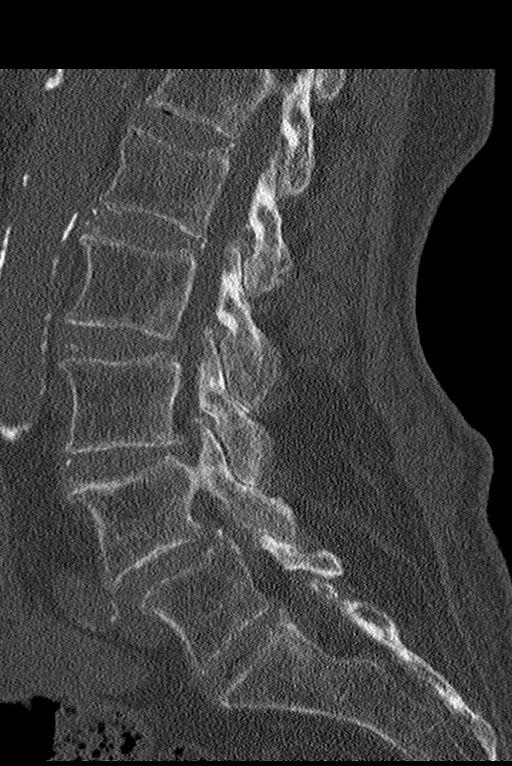
[im 29/57  soft-tissue]
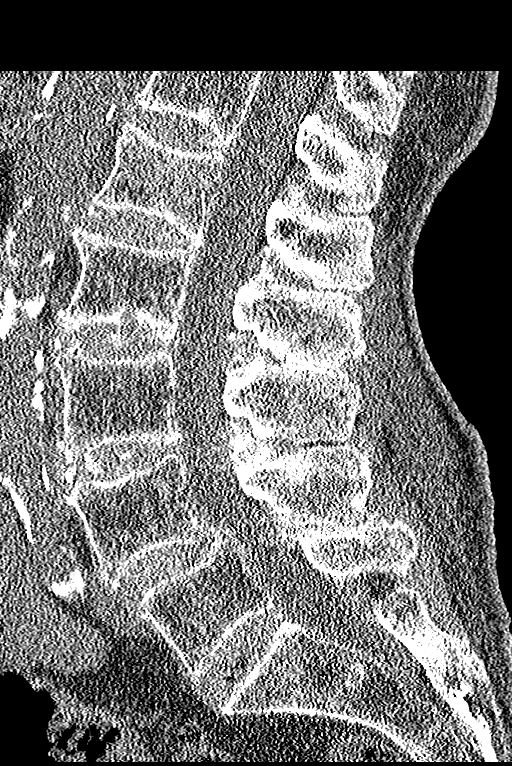
[im 29/57  bone]
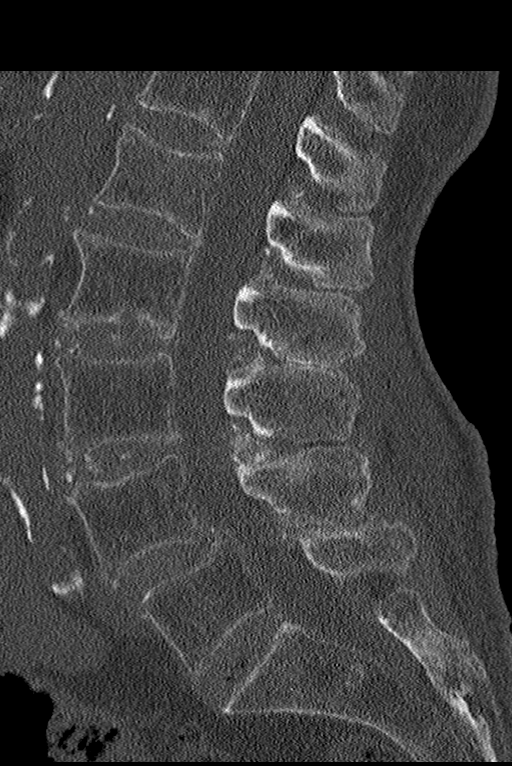
[im 33/57  bone]
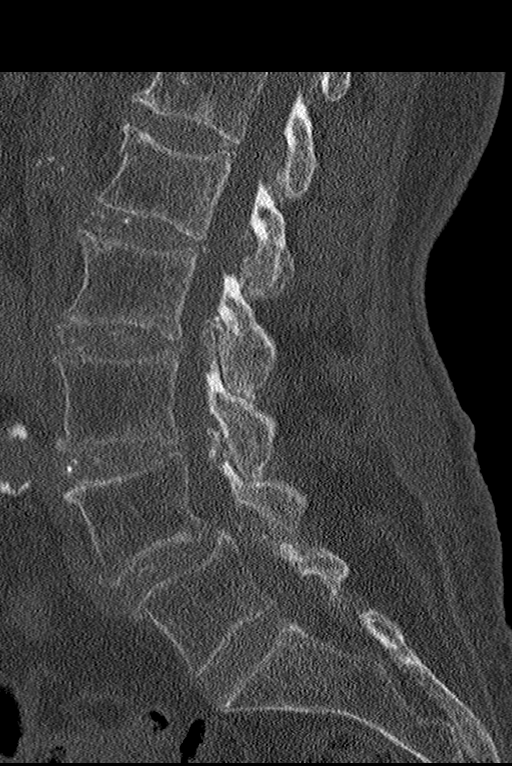
[im 38/57  bone]
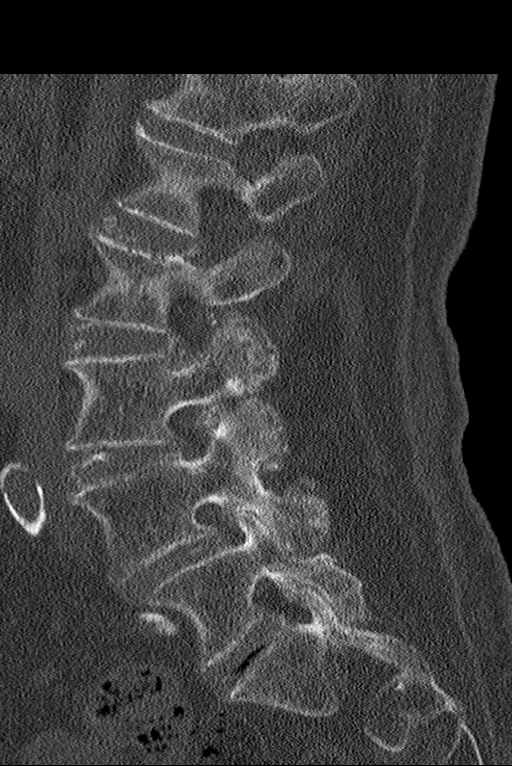

[Series 7: coronal bone · coronal · 0.28mm/px · 3 of 66 slices shown]
[im 14/66  bone]
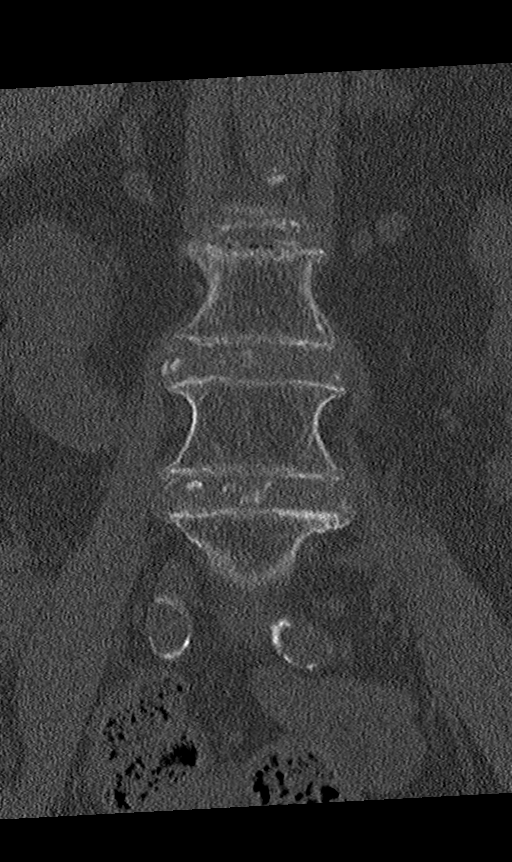
[im 27/66  bone]
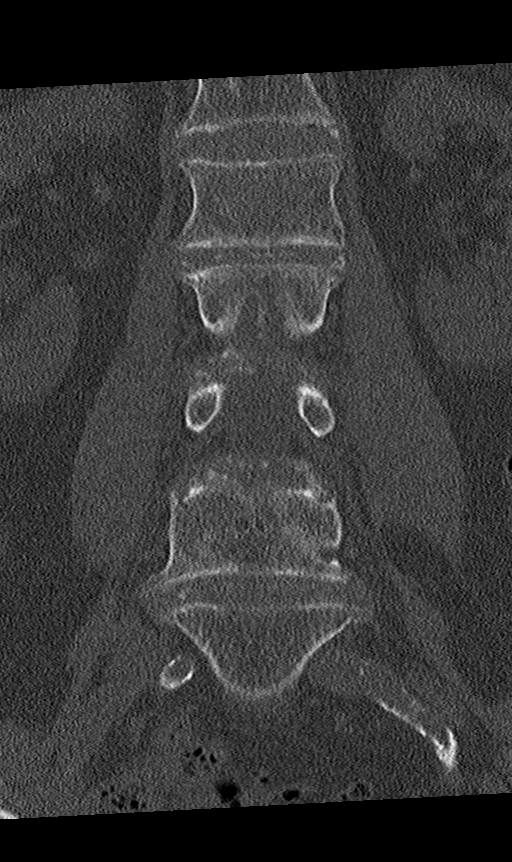
[im 40/66  bone]
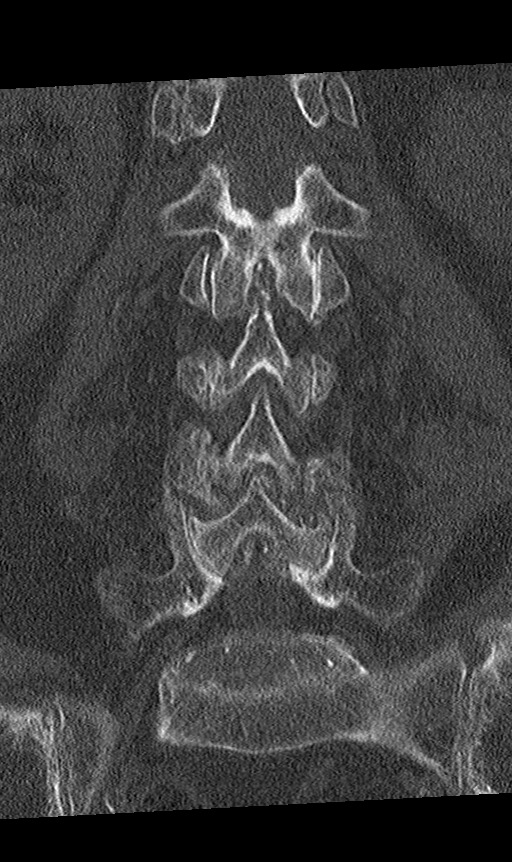

[12 of 33 positions shown; findings below may reference images not displayed]

FINDINGS: Segmentation: 5 lumbar type vertebrae.

Alignment: Stable L4-5 grade 1 anterolisthesis.

Vertebrae: No acute fracture or focal pathologic process.

Paraspinal and other soft tissues: Calcific atherosclerosis of the
abdominal aorta.

Disc levels: Multilevel mild disc space narrowing intervertebral
disc calcification. Severe facet arthropathy. Reticulation of L4-5
spinous processes with degenerative changes compatible with
Baastrup's disease.
IMPRESSION: 1. No acute fracture or dislocation.
2. Lumbar spondylosis with mild disc and severe facet degenerative
changes.
3. Stable L4-5 grade 1 anterolisthesis.
4. Calcific atherosclerosis of abdominal aorta.

By: Lianet Loco M.D.

## 2019-11-03 DEATH — deceased
